# Patient Record
Sex: Male | Born: 1970 | Race: White | Hispanic: No | Marital: Married | Smoking: Never smoker
Health system: Southern US, Community
[De-identification: ages and names within clinical notes are randomized; demographics above are authoritative.]

## PROBLEM LIST (undated history)

## (undated) DIAGNOSIS — K219 Gastro-esophageal reflux disease without esophagitis: Secondary | ICD-10-CM

## (undated) DIAGNOSIS — I10 Essential (primary) hypertension: Secondary | ICD-10-CM

## (undated) DIAGNOSIS — N189 Chronic kidney disease, unspecified: Secondary | ICD-10-CM

## (undated) HISTORY — PX: KNEE SURGERY: SHX244

---

## 2004-01-29 ENCOUNTER — Encounter: Admission: RE | Admit: 2004-01-29 | Discharge: 2004-04-28 | Payer: Self-pay | Admitting: Family Medicine

## 2012-08-26 ENCOUNTER — Emergency Department (HOSPITAL_BASED_OUTPATIENT_CLINIC_OR_DEPARTMENT_OTHER): Payer: No Typology Code available for payment source

## 2012-08-26 ENCOUNTER — Emergency Department (HOSPITAL_BASED_OUTPATIENT_CLINIC_OR_DEPARTMENT_OTHER)
Admission: EM | Admit: 2012-08-26 | Discharge: 2012-08-26 | Disposition: A | Discharge status: Home / Self Care | Payer: No Typology Code available for payment source | Attending: Emergency Medicine | Admitting: Emergency Medicine

## 2012-08-26 ENCOUNTER — Encounter (HOSPITAL_BASED_OUTPATIENT_CLINIC_OR_DEPARTMENT_OTHER): Payer: Self-pay | Admitting: *Deleted

## 2012-08-26 DIAGNOSIS — S8000XA Contusion of unspecified knee, initial encounter: Secondary | ICD-10-CM | POA: Insufficient documentation

## 2012-08-26 DIAGNOSIS — I10 Essential (primary) hypertension: Secondary | ICD-10-CM | POA: Insufficient documentation

## 2012-08-26 DIAGNOSIS — Y92410 Unspecified street and highway as the place of occurrence of the external cause: Secondary | ICD-10-CM | POA: Insufficient documentation

## 2012-08-26 DIAGNOSIS — Y93I9 Activity, other involving external motion: Secondary | ICD-10-CM | POA: Insufficient documentation

## 2012-08-26 DIAGNOSIS — S8001XA Contusion of right knee, initial encounter: Secondary | ICD-10-CM

## 2012-08-26 HISTORY — DX: Essential (primary) hypertension: I10

## 2012-08-26 MED ORDER — HYDROCODONE-ACETAMINOPHEN 5-325 MG PO TABS: 2.0000 | ORAL_TABLET | ORAL | Status: DC | PRN
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
## 2012-08-27 NOTE — ED Provider Notes (Signed)
Medical screening examination/treatment/procedure(s) were performed by non-physician practitioner and as supervising physician I was immediately available for consultation/collaboration.  Chasen Mendell, MD 08/27/12 0842 

## 2012-08-29 ENCOUNTER — Emergency Department (HOSPITAL_BASED_OUTPATIENT_CLINIC_OR_DEPARTMENT_OTHER)
Admission: EM | Admit: 2012-08-29 | Discharge: 2012-08-29 | Disposition: A | Discharge status: Home / Self Care | Payer: No Typology Code available for payment source | Attending: Emergency Medicine | Admitting: Emergency Medicine

## 2012-08-29 ENCOUNTER — Emergency Department (HOSPITAL_BASED_OUTPATIENT_CLINIC_OR_DEPARTMENT_OTHER): Payer: No Typology Code available for payment source

## 2012-08-29 ENCOUNTER — Encounter (HOSPITAL_BASED_OUTPATIENT_CLINIC_OR_DEPARTMENT_OTHER): Payer: Self-pay | Admitting: Family Medicine

## 2012-08-29 DIAGNOSIS — R112 Nausea with vomiting, unspecified: Secondary | ICD-10-CM | POA: Insufficient documentation

## 2012-08-29 DIAGNOSIS — R197 Diarrhea, unspecified: Secondary | ICD-10-CM | POA: Insufficient documentation

## 2012-08-29 DIAGNOSIS — K5289 Other specified noninfective gastroenteritis and colitis: Secondary | ICD-10-CM | POA: Insufficient documentation

## 2012-08-29 DIAGNOSIS — Z79899 Other long term (current) drug therapy: Secondary | ICD-10-CM | POA: Insufficient documentation

## 2012-08-29 DIAGNOSIS — K529 Noninfective gastroenteritis and colitis, unspecified: Secondary | ICD-10-CM

## 2012-08-29 DIAGNOSIS — R51 Headache: Secondary | ICD-10-CM | POA: Insufficient documentation

## 2012-08-29 DIAGNOSIS — I10 Essential (primary) hypertension: Secondary | ICD-10-CM | POA: Insufficient documentation

## 2012-08-29 LAB — URINALYSIS, ROUTINE W REFLEX MICROSCOPIC
Bilirubin Urine: NEGATIVE
Ketones, ur: NEGATIVE mg/dL
Nitrite: NEGATIVE
Protein, ur: NEGATIVE mg/dL
Specific Gravity, Urine: 1.042 — ABNORMAL HIGH (ref 1.005–1.030)
Urobilinogen, UA: 0.2 mg/dL (ref 0.0–1.0)

## 2012-08-29 LAB — COMPREHENSIVE METABOLIC PANEL
ALT: 21 U/L (ref 0–53)
AST: 22 U/L (ref 0–37)
Alkaline Phosphatase: 96 U/L (ref 39–117)
CO2: 21 mEq/L (ref 19–32)
Chloride: 102 mEq/L (ref 96–112)
GFR calc Af Amer: >90 mL/min (ref >90)
GFR calc non Af Amer: >90 mL/min (ref >90)
Glucose, Bld: 113 mg/dL — ABNORMAL HIGH (ref 70–99)
Sodium: 139 mEq/L (ref 135–145)
Total Bilirubin: 0.7 mg/dL (ref 0.3–1.2)

## 2012-08-29 LAB — CBC WITH DIFFERENTIAL/PLATELET
Basophils Absolute: 0 10*3/uL (ref 0.0–0.1)
Eosinophils Relative: 0 % (ref 0–5)
HCT: 42.2 % (ref 39.0–52.0)
Lymphocytes Relative: 2 % — ABNORMAL LOW (ref 12–46)
Lymphs Abs: 0.2 10*3/uL — ABNORMAL LOW (ref 0.7–4.0)
MCV: 80.2 fL (ref 78.0–100.0)
Neutro Abs: 12 10*3/uL — ABNORMAL HIGH (ref 1.7–7.7)
Platelets: 179 10*3/uL (ref 150–400)
RBC: 5.26 MIL/uL (ref 4.22–5.81)
RDW: 12.4 % (ref 11.5–15.5)
WBC: 12.9 10*3/uL — ABNORMAL HIGH (ref 4.0–10.5)

## 2012-08-29 LAB — URINE MICROSCOPIC-ADD ON

## 2012-08-29 MED ORDER — ONDANSETRON HCL 4 MG/2ML IJ SOLN
4.0000 mg | Freq: Once | INTRAMUSCULAR | Status: AC
  Administered 2012-08-29: 4 mg via INTRAVENOUS
  Filled 2012-08-29: qty 2

## 2012-08-29 MED ORDER — SODIUM CHLORIDE 0.9 % IV SOLN
Freq: Once | INTRAVENOUS | Status: AC
  Administered 2012-08-29: 1000 mL via INTRAVENOUS

## 2012-08-29 MED ORDER — IOHEXOL 300 MG/ML  SOLN
100.0000 mL | Freq: Once | INTRAMUSCULAR | Status: AC | PRN
  Administered 2012-08-29: 100 mL via INTRAVENOUS

## 2012-08-29 MED ORDER — SODIUM CHLORIDE 0.9 % IV BOLUS (SEPSIS)
1000.0000 mL | Freq: Once | INTRAVENOUS | Status: AC
  Administered 2012-08-29: 1000 mL via INTRAVENOUS

## 2012-08-29 MED ORDER — SODIUM CHLORIDE 0.9 % IV SOLN: Freq: Once | INTRAVENOUS | Status: DC

## 2012-08-29 MED ORDER — ACETAMINOPHEN 325 MG PO TABS
650.0000 mg | ORAL_TABLET | Freq: Once | ORAL | Status: AC
  Administered 2012-08-29: 650 mg via ORAL
  Filled 2012-08-29: qty 2

## 2012-08-29 MED ORDER — ONDANSETRON 8 MG PO TBDP: 8.0000 mg | ORAL_TABLET | Freq: Three times a day (TID) | ORAL | Status: DC | PRN
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
## 2012-08-29 NOTE — ED Notes (Signed)
Patient transported to CT 

## 2015-08-31 ENCOUNTER — Emergency Department (HOSPITAL_BASED_OUTPATIENT_CLINIC_OR_DEPARTMENT_OTHER): Payer: 59

## 2015-08-31 ENCOUNTER — Emergency Department (HOSPITAL_BASED_OUTPATIENT_CLINIC_OR_DEPARTMENT_OTHER)
Admission: EM | Admit: 2015-08-31 | Discharge: 2015-08-31 | Disposition: A | Discharge status: Home / Self Care | Payer: 59 | Attending: Emergency Medicine | Admitting: Emergency Medicine

## 2015-08-31 ENCOUNTER — Encounter (HOSPITAL_BASED_OUTPATIENT_CLINIC_OR_DEPARTMENT_OTHER): Payer: Self-pay

## 2015-08-31 DIAGNOSIS — R1031 Right lower quadrant pain: Secondary | ICD-10-CM | POA: Diagnosis present

## 2015-08-31 DIAGNOSIS — R112 Nausea with vomiting, unspecified: Secondary | ICD-10-CM | POA: Insufficient documentation

## 2015-08-31 DIAGNOSIS — R109 Unspecified abdominal pain: Secondary | ICD-10-CM

## 2015-08-31 DIAGNOSIS — Z8719 Personal history of other diseases of the digestive system: Secondary | ICD-10-CM | POA: Diagnosis not present

## 2015-08-31 DIAGNOSIS — K219 Gastro-esophageal reflux disease without esophagitis: Secondary | ICD-10-CM | POA: Insufficient documentation

## 2015-08-31 DIAGNOSIS — N201 Calculus of ureter: Secondary | ICD-10-CM | POA: Diagnosis not present

## 2015-08-31 DIAGNOSIS — I10 Essential (primary) hypertension: Secondary | ICD-10-CM | POA: Diagnosis not present

## 2015-08-31 HISTORY — DX: Gastro-esophageal reflux disease without esophagitis: K21.9

## 2015-08-31 LAB — URINALYSIS, ROUTINE W REFLEX MICROSCOPIC
Bilirubin Urine: NEGATIVE
GLUCOSE, UA: NEGATIVE mg/dL
Ketones, ur: NEGATIVE mg/dL
Leukocytes, UA: NEGATIVE
Nitrite: NEGATIVE
PROTEIN: NEGATIVE mg/dL
SPECIFIC GRAVITY, URINE: 1.029 (ref 1.005–1.030)
Urobilinogen, UA: 0.2 mg/dL (ref 0.0–1.0)
pH: 5 (ref 5.0–8.0)

## 2015-08-31 LAB — URINE MICROSCOPIC-ADD ON

## 2015-08-31 MED ORDER — TAMSULOSIN HCL 0.4 MG PO CAPS: ORAL_CAPSULE | ORAL | Status: DC

## 2015-08-31 MED ORDER — TAMSULOSIN HCL 0.4 MG PO CAPS
0.4000 mg | ORAL_CAPSULE | Freq: Once | ORAL | Status: AC
  Administered 2015-08-31: 0.4 mg via ORAL
  Filled 2015-08-31: qty 1

## 2015-08-31 MED ORDER — ONDANSETRON HCL 4 MG/2ML IJ SOLN
4.0000 mg | Freq: Once | INTRAMUSCULAR | Status: AC
  Administered 2015-08-31: 4 mg via INTRAVENOUS
  Filled 2015-08-31: qty 2

## 2015-08-31 MED ORDER — KETOROLAC TROMETHAMINE 15 MG/ML IJ SOLN
15.0000 mg | Freq: Once | INTRAMUSCULAR | Status: AC
  Administered 2015-08-31: 15 mg via INTRAVENOUS
  Filled 2015-08-31: qty 1

## 2015-08-31 MED ORDER — SODIUM CHLORIDE 0.9 % IV SOLN
INTRAVENOUS | Status: DC
  Administered 2015-08-31: 1000 mL via INTRAVENOUS

## 2015-08-31 MED ORDER — ONDANSETRON 8 MG PO TBDP: 8.0000 mg | ORAL_TABLET | Freq: Three times a day (TID) | ORAL | Status: AC | PRN

## 2015-08-31 MED ORDER — HYDROMORPHONE HCL 1 MG/ML IJ SOLN
1.0000 mg | Freq: Once | INTRAMUSCULAR | Status: AC
  Administered 2015-08-31: 1 mg via INTRAVENOUS
  Filled 2015-08-31: qty 1

## 2015-08-31 MED ORDER — HYDROMORPHONE HCL 4 MG PO TABS: 2.0000 mg | ORAL_TABLET | ORAL | Status: DC | PRN

## 2015-08-31 NOTE — ED Notes (Signed)
Reports right flank pain that started around 1am with nausea.  Reports started in stomach but not in right flank.

## 2015-08-31 NOTE — ED Provider Notes (Signed)
CSN: [id number]     Arrival date & time 08/31/15  0246 History   First MD Initiated Contact with Patient 08/31/15 0254     Chief Complaint  Patient presents with  . Flank Pain     (Consider location/radiation/quality/duration/timing/severity/associated sxs/prior Treatment) HPI  This is a 44 year old male who had the sudden onset of right lower quadrant pain radiating to his right flank that awakened him from sleep about 2 hours ago. He rates the pain a 7 out of 10. The pain is not changed with movement or palpation. There is been associated nausea and vomiting. He has not noticed any blood in his urine. He has no history of kidney stones. He characterizes the pain as not like anything he is experienced in the past.  Past Medical History  Diagnosis Date  . Hypertension   . GERD (gastroesophageal reflux disease)    Past Surgical History  Procedure Laterality Date  . Knee surgery     No family history on file. Social History  Substance Use Topics  . Smoking status: Never Smoker   . Smokeless tobacco: None  . Alcohol Use: No    Review of Systems  All other systems reviewed and are negative.   Allergies  Ampicillin  Home Medications   Prior to Admission medications   Medication Sig Start Date End Date Taking? Authorizing Provider  omeprazole (PRILOSEC) 40 MG capsule Take 40 mg by mouth daily.   Yes Historical Provider, MD  BENAZEPRIL HCL PO Take by mouth.    Historical Provider, MD  HYDROcodone-acetaminophen (NORCO/VICODIN) 5-325 MG per tablet Take 2 tablets by mouth every 4 (four) hours as needed for pain. 08/26/12   Elson Areas, PA-C  ondansetron (ZOFRAN ODT) 8 MG disintegrating tablet Take 1 tablet (8 mg total) by mouth every 8 (eight) hours as needed for nausea. 08/29/12   Rolan Bucco, MD   BP 142/102 mmHg  Pulse 84  Temp(Src) 98.3 F (36.8 C) (Oral)  Resp 20  Ht 6' (1.829 m)  Wt 245 lb (111.131 kg)  BMI 33.22 kg/m2  SpO2 96%   Physical Exam  General:  Well-developed, well-nourished male in no acute distress; appearance consistent with age of record HENT: normocephalic; atraumatic Eyes: pupils equal, round and reactive to light; extraocular muscles intact Neck: supple Heart: regular rate and rhythm Lungs: clear to auscultation bilaterally Abdomen: soft; nondistended; nontender; no masses or hepatosplenomegaly; bowel sounds present GU: No CVA tenderness Extremities: No deformity; full range of motion; pulses normal Neurologic: Awake, alert and oriented; motor function intact in all extremities and symmetric; no facial droop Skin: Warm and dry Psychiatric: Normal mood and affect    ED Course  Procedures (including critical care time)   MDM  Nursing notes and vitals signs, including pulse oximetry, reviewed.  Summary of this visit's results, reviewed by myself:  Labs:  Results for orders placed or performed during the hospital encounter of 08/31/15 (from the past 24 hour(s))  Urinalysis, Routine w reflex microscopic (not at [facility])     Status: Abnormal   Collection Time: 08/31/15  3:02 AM  Result Value Ref Range   Color, Urine YELLOW YELLOW   APPearance CLOUDY (A) CLEAR   Specific Gravity, Urine 1.029 1.005 - 1.030   pH 5.0 5.0 - 8.0   Glucose, UA NEGATIVE NEGATIVE mg/dL   Hgb urine dipstick SMALL (A) NEGATIVE   Bilirubin Urine NEGATIVE NEGATIVE   Ketones, ur NEGATIVE NEGATIVE mg/dL   Protein, ur NEGATIVE NEGATIVE mg/dL   Urobilinogen,  UA 0.2 0.0 - 1.0 mg/dL   Nitrite NEGATIVE NEGATIVE   Leukocytes, UA NEGATIVE NEGATIVE  Urine microscopic-add on     Status: None   Collection Time: 08/31/15  3:02 AM  Result Value Ref Range   Squamous Epithelial / LPF RARE RARE   WBC, UA 0-2 <3 WBC/hpf   RBC / HPF 3-6 <3 RBC/hpf   Urine-Other MUCOUS PRESENT     Imaging Studies: Ct Renal Stone Study  08/31/2015  CLINICAL DATA:  Acute onset of right flank pain.  Initial encounter. EXAM: CT ABDOMEN AND PELVIS WITHOUT CONTRAST TECHNIQUE:  Multidetector CT imaging of the abdomen and pelvis was performed following the standard protocol without IV contrast. COMPARISON:  CT of the abdomen and pelvis from 08/29/2012 FINDINGS: The visualized lung bases are clear. The liver and spleen are unremarkable in appearance. The gallbladder is within normal limits. The pancreas and adrenal glands are unremarkable. There is minimal right-sided hydronephrosis, with an obstructing 7 x 6 mm stone noted in the proximal right ureter, 3 cm below the right renal pelvis. Nonspecific perinephric stranding is noted bilaterally. No nonobstructing renal stones are identified. No free fluid is identified. The small bowel is unremarkable in appearance. The stomach is within normal limits. No acute vascular abnormalities are seen. The appendix is normal in caliber, without evidence of appendicitis. Minimal diverticulosis is noted at the proximal sigmoid colon. The colon is otherwise unremarkable in appearance. The bladder is decompressed and not well assessed. The prostate is normal in size. No inguinal lymphadenopathy is seen. No acute osseous abnormalities are identified. IMPRESSION: 1. Minimal right-sided hydronephrosis, with an obstructing 7 x 6 mm stone in the proximal right ureter, 3 cm below the right renal pelvis. 2. Minimal diverticulosis at the proximal sigmoid colon. Electronically Signed   By: Roanna RaiderJeffery  Chang M.D.   On: 08/31/2015 03:39   3:48 AM Pain well relieved with IV medications.     Paula LibraJohn Elyshia Kumagai, MD 08/31/15 [phone]

## 2015-09-02 ENCOUNTER — Encounter (HOSPITAL_COMMUNITY): Payer: Self-pay | Admitting: General Practice

## 2015-09-02 ENCOUNTER — Other Ambulatory Visit: Payer: Self-pay | Admitting: Urology

## 2015-09-02 ENCOUNTER — Ambulatory Visit (HOSPITAL_COMMUNITY): Payer: 59

## 2015-09-02 ENCOUNTER — Encounter (HOSPITAL_COMMUNITY)
Admission: AD | Disposition: A | Discharge status: Home / Self Care | Payer: Self-pay | Source: Ambulatory Visit | Attending: Urology

## 2015-09-02 ENCOUNTER — Ambulatory Visit (HOSPITAL_COMMUNITY)
Admission: AD | Admit: 2015-09-02 | Discharge: 2015-09-02 | Disposition: A | Discharge status: Home / Self Care | Payer: 59 | Source: Ambulatory Visit | Attending: Urology | Admitting: Urology

## 2015-09-02 DIAGNOSIS — I10 Essential (primary) hypertension: Secondary | ICD-10-CM | POA: Diagnosis not present

## 2015-09-02 DIAGNOSIS — N201 Calculus of ureter: Secondary | ICD-10-CM | POA: Insufficient documentation

## 2015-09-02 DIAGNOSIS — Z6834 Body mass index (BMI) 34.0-34.9, adult: Secondary | ICD-10-CM | POA: Diagnosis not present

## 2015-09-02 DIAGNOSIS — E669 Obesity, unspecified: Secondary | ICD-10-CM | POA: Diagnosis not present

## 2015-09-02 DIAGNOSIS — K219 Gastro-esophageal reflux disease without esophagitis: Secondary | ICD-10-CM | POA: Diagnosis not present

## 2015-09-02 HISTORY — DX: Chronic kidney disease, unspecified: N18.9

## 2015-09-02 SURGERY — LITHOTRIPSY, ESWL
Anesthesia: LOCAL | Laterality: Right

## 2015-09-02 MED ORDER — HYDROMORPHONE HCL 4 MG PO TABS: 4.0000 mg | ORAL_TABLET | ORAL | Status: AC | PRN

## 2015-09-02 MED ORDER — CEFAZOLIN SODIUM-DEXTROSE 2-3 GM-% IV SOLR
2.0000 g | INTRAVENOUS | Status: AC
Start: 2015-09-02 — End: 2015-09-02
  Administered 2015-09-02: 2 g via INTRAVENOUS
  Filled 2015-09-02: qty 50

## 2015-09-02 MED ORDER — SODIUM CHLORIDE 0.9 % IV SOLN
INTRAVENOUS | Status: DC
  Administered 2015-09-02: 15:00:00 via INTRAVENOUS

## 2015-09-02 MED ORDER — SULFAMETHOXAZOLE-TRIMETHOPRIM 800-160 MG PO TABS: 1.0000 | ORAL_TABLET | Freq: Two times a day (BID) | ORAL | Status: AC

## 2015-09-02 MED ORDER — DIPHENHYDRAMINE HCL 25 MG PO CAPS
25.0000 mg | ORAL_CAPSULE | ORAL | Status: AC
  Administered 2015-09-02: 25 mg via ORAL
  Filled 2015-09-02: qty 1

## 2015-09-02 MED ORDER — DIAZEPAM 5 MG PO TABS
10.0000 mg | ORAL_TABLET | ORAL | Status: AC
  Administered 2015-09-02: 10 mg via ORAL
  Filled 2015-09-02: qty 2

## 2015-09-02 MED ORDER — SENNOSIDES-DOCUSATE SODIUM 8.6-50 MG PO TABS: 1.0000 | ORAL_TABLET | Freq: Two times a day (BID) | ORAL | Status: AC

## 2015-09-02 MED ORDER — TAMSULOSIN HCL 0.4 MG PO CAPS: ORAL_CAPSULE | ORAL | Status: AC

## 2015-09-02 NOTE — Discharge Instructions (Signed)
1 - You may have urinary urgency (bladder spasms), bloody urine on / off, and pass small stone fragments for up to 2 weeks. This is normal. ° °2 - Call MD or go to ER for fever >102, severe pain / nausea / vomiting not relieved by medications, or acute change in medical status ° °

## 2015-09-02 NOTE — Brief Op Note (Signed)
09/02/2015  6:13 PM  PATIENT:  Steven Preston  44 y.o. male  PRE-OPERATIVE DIAGNOSIS:  RIGHT URETERAL STONE  POST-OPERATIVE DIAGNOSIS:  * No post-op diagnosis entered *  PROCEDURE:  Procedure(s): RIGHT EXTRACORPOREAL SHOCK WAVE LITHOTRIPSY (ESWL) (Right)  SURGEON:  Surgeon(s) and Role:    * Sebastian Acheheodore Izzie Geers, MD - Primary  PHYSICIAN ASSISTANT:   ASSISTANTS: none   ANESTHESIA:   MAC  EBL:     BLOOD ADMINISTERED:none  DRAINS: none   LOCAL MEDICATIONS USED:  NONE  SPECIMEN:  No Specimen  DISPOSITION OF SPECIMEN:  N/A  COUNTS:  YES  TOURNIQUET:  * No tourniquets in log *  DICTATION: .Note written in paper chart  PLAN OF CARE: Discharge to home after PACU  PATIENT DISPOSITION:  PACU - hemodynamically stable.   Delay start of Pharmacological VTE agent (>24hrs) due to surgical blood loss or risk of bleeding: yes

## 2015-09-02 NOTE — H&P (Signed)
Steven Preston is an 44 y.o. male.    Chief Complaint: Pre-op Right Shockwave Lithotripsy  HPI:   1 - Nephrolithiasis - 7mm Rt prox ureteral stone (700 HU, SSD 15cm) visible just lateral to upper border of L4 on CT scout image by ER CT 08/2015. No additional stones, mild hydro. UA without in fectious parameters. NO fevers. NO prior episodes. KUB today shows some antegrade progression to lower 1/3 or ueter.   PMH sig for ortho survery, GERD, HTN (ACEI). 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 His PCP is Steven HazelLisa Miller MD with Steven Preston.  Today "Steven Preston" is seen to proceed with shockwave lithotripsy for his bothersome right ureteral stone. No interval fevers.  Past Medical History  Diagnosis Date  . Hypertension   . GERD (gastroesophageal reflux disease)     Past Surgical History  Procedure Laterality Date  . Knee surgery      No family history on file. Social History:  reports that he has never smoked. He does not have any smokeless tobacco history on file. He reports that he does not drink alcohol or use illicit drugs.  Allergies:  Allergies  Allergen Reactions  . Ampicillin     No prescriptions prior to admission    No results found for this or any previous visit (from the past 48 hour(s)). No results found.  Review of Systems  Constitutional: Negative.  Negative for fever and chills.  HENT: Negative.   Eyes: Negative.   Respiratory: Negative.   Cardiovascular: Negative.   Gastrointestinal: Positive for nausea. Negative for vomiting.  Genitourinary: Positive for flank pain.  Musculoskeletal: Negative.   Skin: Negative.   Neurological: Negative.   Endo/Heme/Allergies: Negative.   Psychiatric/Behavioral: Negative.     There were no vitals taken for this visit. Physical Exam  Constitutional: He appears well-developed.  HENT:  Head: Normocephalic.  Eyes: Pupils are equal, round, and reactive to light.  Neck: Normal range of motion.  Cardiovascular: Normal rate.    Respiratory: Effort normal.  GI: Soft.  Genitourinary:  Moderate Rt CVAT with radiation to Rt groin.   Musculoskeletal: Normal range of motion.  Neurological: He is alert.  Skin: Skin is warm.  Psychiatric: He has a normal mood and affect. His behavior is normal. Judgment and thought content normal.     Assessment/Plan  1 - Nephrolithiasis - Pt with some antegrade progression by KUB today. He is quite miserable from colic symptoms and wants to proceed with definitive therapy next available.  We rediscussed shockwave lithotripsy in detail as well as my "rule of 9s" with stones <139mm, less than 900 HU, and skin to stone distance <9cm having approximately 90% treatment success with single session of treatment. We then readdressed how stones that are larger, more dense, and in patients with less favorable anatomy have incrementally decreased success rates. We rediscussed risks including, bleeding, infection, hematoma, loss of kidney, need for staged therapy, need for adjunctive therapy and requirement to refrain from any anticoagulants, anti-platelet or aspirin-like products peri-procedureally.   After careful consideration, the patient has chosen to proceed today as planned.   Steven Preston 09/02/2015, 12:24 PM

## 2015-09-03 ENCOUNTER — Emergency Department (HOSPITAL_BASED_OUTPATIENT_CLINIC_OR_DEPARTMENT_OTHER): Payer: 59

## 2015-09-03 ENCOUNTER — Emergency Department (HOSPITAL_COMMUNITY)
Admission: EM | Admit: 2015-09-03 | Discharge: 2015-09-04 | Disposition: A | Discharge status: Home / Self Care | Payer: 59 | Attending: Emergency Medicine | Admitting: Emergency Medicine

## 2015-09-03 ENCOUNTER — Encounter (HOSPITAL_BASED_OUTPATIENT_CLINIC_OR_DEPARTMENT_OTHER): Payer: Self-pay | Admitting: Emergency Medicine

## 2015-09-03 DIAGNOSIS — Z87442 Personal history of urinary calculi: Secondary | ICD-10-CM | POA: Insufficient documentation

## 2015-09-03 DIAGNOSIS — N132 Hydronephrosis with renal and ureteral calculous obstruction: Secondary | ICD-10-CM | POA: Insufficient documentation

## 2015-09-03 DIAGNOSIS — K219 Gastro-esophageal reflux disease without esophagitis: Secondary | ICD-10-CM | POA: Diagnosis not present

## 2015-09-03 DIAGNOSIS — Z6834 Body mass index (BMI) 34.0-34.9, adult: Secondary | ICD-10-CM | POA: Insufficient documentation

## 2015-09-03 DIAGNOSIS — N200 Calculus of kidney: Secondary | ICD-10-CM | POA: Diagnosis not present

## 2015-09-03 DIAGNOSIS — Z9889 Other specified postprocedural states: Secondary | ICD-10-CM | POA: Diagnosis not present

## 2015-09-03 DIAGNOSIS — Z79899 Other long term (current) drug therapy: Secondary | ICD-10-CM | POA: Diagnosis not present

## 2015-09-03 DIAGNOSIS — I10 Essential (primary) hypertension: Secondary | ICD-10-CM | POA: Insufficient documentation

## 2015-09-03 DIAGNOSIS — E669 Obesity, unspecified: Secondary | ICD-10-CM | POA: Insufficient documentation

## 2015-09-03 DIAGNOSIS — R109 Unspecified abdominal pain: Secondary | ICD-10-CM

## 2015-09-03 DIAGNOSIS — R1032 Left lower quadrant pain: Secondary | ICD-10-CM | POA: Diagnosis present

## 2015-09-03 LAB — CBC WITH DIFFERENTIAL/PLATELET
Basophils Absolute: 0.1 10*3/uL (ref 0.0–0.1)
Basophils Relative: 1 %
EOS PCT: 0 %
Eosinophils Absolute: 0.1 10*3/uL (ref 0.0–0.7)
HCT: 37.1 % — ABNORMAL LOW (ref 39.0–52.0)
Hemoglobin: 13.3 g/dL (ref 13.0–17.0)
LYMPHS ABS: 1.1 10*3/uL (ref 0.7–4.0)
LYMPHS PCT: 8 %
MCH: 29.2 pg (ref 26.0–34.0)
MCHC: 35.8 g/dL (ref 30.0–36.0)
MCV: 81.4 fL (ref 78.0–100.0)
MONO ABS: 1.4 10*3/uL — AB (ref 0.1–1.0)
MONOS PCT: 10 %
Neutro Abs: 10.8 10*3/uL — ABNORMAL HIGH (ref 1.7–7.7)
Neutrophils Relative %: 81 %
PLATELETS: 241 10*3/uL (ref 150–400)
RBC: 4.56 MIL/uL (ref 4.22–5.81)
RDW: 12.3 % (ref 11.5–15.5)
WBC: 13.4 10*3/uL — ABNORMAL HIGH (ref 4.0–10.5)

## 2015-09-03 LAB — URINE MICROSCOPIC-ADD ON

## 2015-09-03 LAB — URINALYSIS, ROUTINE W REFLEX MICROSCOPIC
Bilirubin Urine: NEGATIVE
GLUCOSE, UA: NEGATIVE mg/dL
Ketones, ur: 15 mg/dL — AB
Nitrite: NEGATIVE
Protein, ur: 30 mg/dL — AB
SPECIFIC GRAVITY, URINE: 1.021 (ref 1.005–1.030)
pH: 6 (ref 5.0–8.0)

## 2015-09-03 LAB — COMPREHENSIVE METABOLIC PANEL
ALT: 50 U/L (ref 17–63)
AST: 43 U/L — ABNORMAL HIGH (ref 15–41)
Albumin: 4 g/dL (ref 3.5–5.0)
Alkaline Phosphatase: 90 U/L (ref 38–126)
Anion gap: 7 (ref 5–15)
BUN: 14 mg/dL (ref 6–20)
CHLORIDE: 103 mmol/L (ref 101–111)
CO2: 25 mmol/L (ref 22–32)
CREATININE: 1.86 mg/dL — AB (ref 0.61–1.24)
Calcium: 8.6 mg/dL — ABNORMAL LOW (ref 8.9–10.3)
GFR, EST AFRICAN AMERICAN: 48 mL/min — AB (ref >60)
GFR, EST NON AFRICAN AMERICAN: 42 mL/min — AB (ref >60)
Glucose, Bld: 126 mg/dL — ABNORMAL HIGH (ref 65–99)
POTASSIUM: 3.6 mmol/L (ref 3.5–5.1)
Sodium: 135 mmol/L (ref 135–145)
Total Bilirubin: 1.1 mg/dL (ref 0.3–1.2)
Total Protein: 7.4 g/dL (ref 6.5–8.1)

## 2015-09-03 MED ORDER — HYDROMORPHONE HCL 1 MG/ML IJ SOLN
1.0000 mg | Freq: Once | INTRAMUSCULAR | Status: AC
  Administered 2015-09-03: 1 mg via INTRAVENOUS
  Filled 2015-09-03: qty 1

## 2015-09-03 MED ORDER — ONDANSETRON HCL 4 MG/2ML IJ SOLN
4.0000 mg | Freq: Once | INTRAMUSCULAR | Status: AC
  Administered 2015-09-03: 4 mg via INTRAVENOUS
  Filled 2015-09-03: qty 2

## 2015-09-03 MED ORDER — SODIUM CHLORIDE 0.9 % IV BOLUS (SEPSIS)
1000.0000 mL | Freq: Once | INTRAVENOUS | Status: AC
Start: 2015-09-03 — End: 2015-09-03
  Administered 2015-09-03: 1000 mL via INTRAVENOUS

## 2015-09-03 NOTE — ED Notes (Signed)
Pt c/o flank pain and abd pain,  Had lithotripsy yesterday for 6 X 7 mm kidney pain  Bruising to rt groin are  Cont to have pain and constipation

## 2015-09-03 NOTE — Discharge Instructions (Signed)
Flank Pain °Flank pain refers to pain that is located on the side of the body between the upper abdomen and the back. The pain may occur over a short period of time (acute) or may be long-term or reoccurring (chronic). It may be mild or severe. Flank pain can be caused by many things. °CAUSES  °Some of the more common causes of flank pain include: °· Muscle strains.   °· Muscle spasms.   °· A disease of your spine (vertebral disk disease).   °· A lung infection (pneumonia).   °· Fluid around your lungs (pulmonary edema).   °· A kidney infection.   °· Kidney stones.   °· A very painful skin rash caused by the chickenpox virus (shingles).   °· Gallbladder disease.   °HOME CARE INSTRUCTIONS  °Home care will depend on the cause of your pain. In general, °· Rest as directed by your caregiver. °· Drink enough fluids to keep your urine clear or pale yellow. °· Only take over-the-counter or prescription medicines as directed by your caregiver. Some medicines may help relieve the pain. °· Tell your caregiver about any changes in your pain. °· Follow up with your caregiver as directed. °SEEK IMMEDIATE MEDICAL CARE IF:  °· Your pain is not controlled with medicine.   °· You have new or worsening symptoms. °· Your pain increases.   °· You have abdominal pain.   °· You have shortness of breath.   °· You have persistent nausea or vomiting.   °· You have swelling in your abdomen.   °· You feel faint or pass out.   °· You have blood in your urine. °· You have a fever or persistent symptoms for more than 2-3 days. °· You have a fever and your symptoms suddenly get worse. °MAKE SURE YOU:  °· Understand these instructions. °· Will watch your condition. °· Will get help right away if you are not doing well or get worse. °  °This information is not intended to replace advice given to you by your health care provider. Make sure you discuss any questions you have with your health care provider. °  °Document Released: 11/26/2005 Document  Revised: 06/29/2012 Document Reviewed: 05/19/2012 °Elsevier Interactive Patient Education ©2016 [employer] ° °

## 2015-09-03 NOTE — ED Notes (Signed)
Patient reports since lunch time he has been unable to pass "gas" since lunch time. He had lithotripsy yesterday and was "successful" Patient reports he lower pelvic pain and bloating.

## 2015-09-03 NOTE — ED Provider Notes (Signed)
CSN: [id number]     Arrival date & time 09/03/15  1918 History  By signing my name below, I, Gwenyth Oberatherine Macek, attest that this documentation has been prepared under the direction and in the presence of Alvira MondayErin Rembert Browe, MD.  Electronically Signed: Gwenyth Oberatherine Macek, ED Scribe. 09/03/2015. 8:01 PM.   Chief Complaint  Patient presents with  . Flank Pain   Patient is a 44 y.o. male presenting with abdominal pain. The history is provided by the patient. No language interpreter was used.  Abdominal Pain Pain location:  LLQ and RLQ Pain quality: sharp   Pain severity:  Moderate Onset quality:  Gradual Duration:  1 day Progression:  Waxing and waning Chronicity:  New Context: previous surgery   Ineffective treatments:  Acetaminophen Associated symptoms: constipation, nausea and vomiting (one episode this AM)   Associated symptoms: no chest pain, no diarrhea, no fever, no shortness of breath and no sore throat     HPI Comments: Steven DaubDon W Preston is a 44 y.o. male with a history of HTN and kidney stones who presents to the Emergency Department complaining of waxing and waning, sharp, bilateral lower abdominal pain, worse on the right, that started this morning. Pt states 1 episode of vomiting, inability to pass gas, lower back pain and decreased appetite as associated symptoms. He was able to tolerate PO intake PTA. Pt has taken Tylenol, Flomax, a laxative and Gas-X with no relief. Pt was seen in the ED on 11/12 and was diagnosed with a 7 mm uretal stone on the right, which required lithotripsy yesterday. He reports back pain is similar to pain experienced with kidney stones. Pt had a small BM following lithotripsy, but has not been able to pass gas since this morning.  Pt denies a history of other abdominal surgeries. He also denies difficulty urinating.  Urologist Berneice HeinrichManny  Past Medical History  Diagnosis Date  . Hypertension   . GERD (gastroesophageal reflux disease)   . kidney stones    Past  Surgical History  Procedure Laterality Date  . Knee surgery Bilateral     meniscal tears repair   History reviewed. No pertinent family history. Social History  Substance Use Topics  . Smoking status: Never Smoker   . Smokeless tobacco: None  . Alcohol Use: No    Review of Systems  Constitutional: Negative for fever.  HENT: Negative for sore throat.   Eyes: Negative for visual disturbance.  Respiratory: Negative for shortness of breath.   Cardiovascular: Negative for chest pain.  Gastrointestinal: Positive for nausea, vomiting (one episode this AM), abdominal pain and constipation. Negative for diarrhea.  Genitourinary: Positive for flank pain. Negative for difficulty urinating.  Musculoskeletal: Positive for back pain. Negative for neck stiffness.  Skin: Negative for rash.  Neurological: Negative for syncope and headaches.   Allergies  Ampicillin  Home Medications   Prior to Admission medications   Medication Sig Start Date End Date Taking? Authorizing Provider  BENAZEPRIL HCL PO Take by mouth.    Historical Provider, MD  HYDROmorphone (DILAUDID) 4 MG tablet Take 1 tablet (4 mg total) by mouth every 4 (four) hours as needed for moderate pain or severe pain (for pain). From kidney stone 09/02/15   Sebastian Acheheodore Manny, MD  omeprazole (PRILOSEC) 40 MG capsule Take 40 mg by mouth daily.    Historical Provider, MD  ondansetron (ZOFRAN ODT) 8 MG disintegrating tablet Take 1 tablet (8 mg total) by mouth every 8 (eight) hours as needed for nausea or vomiting. 08/31/15  John Molpus, MD  senna-docusate (SENOKOT-S) 8.6-50 MG tablet Take 1 tablet by mouth 2 (two) times daily. While taking pain meds to prevent constipation 09/02/15   Sebastian Ache, MD  sulfamethoxazole-trimethoprim (BACTRIM DS,SEPTRA DS) 800-160 MG tablet Take 1 tablet by mouth 2 (two) times daily. X 5 days to prevent infection 09/02/15   Sebastian Ache, MD  tamsulosin [facility]) 0.4 MG CAPS capsule To help pass ureteral stone  fragments 09/02/15   Sebastian Ache, MD   BP 129/80 mmHg  Pulse 110  Temp(Src) 98.6 F (37 C) (Oral)  Resp 18  Ht 6' (1.829 m)  Wt 252 lb (114.306 kg)  BMI 34.17 kg/m2  SpO2 97% Physical Exam  Constitutional: He appears well-developed and well-nourished. No distress.  HENT:  Head: Normocephalic and atraumatic.  Eyes: Conjunctivae and EOM are normal.  Neck: Neck supple. No tracheal deviation present.  Cardiovascular: Normal rate.   Pulmonary/Chest: Effort normal. No respiratory distress.  Skin: Skin is warm and dry.  Psychiatric: He has a normal mood and affect. His behavior is normal.  Nursing note and vitals reviewed.   ED Course  Procedures  DIAGNOSTIC STUDIES: Oxygen Saturation is 100% on RA, normal by my interpretation.    COORDINATION OF CARE: 7:54 PM Discussed treatment plan with pt which includes IV Dilaudid, lab work and consultation with urology. Pt agreed to plan.   Labs Review Labs Reviewed  URINALYSIS, ROUTINE W REFLEX MICROSCOPIC (NOT AT [facility]) - Abnormal; Notable for the following:    APPearance CLOUDY (*)    Hgb urine dipstick LARGE (*)    Ketones, ur 15 (*)    Protein, ur 30 (*)    Leukocytes, UA MODERATE (*)    All other components within normal limits  CBC WITH DIFFERENTIAL/PLATELET - Abnormal; Notable for the following:    WBC 13.4 (*)    HCT 37.1 (*)    Neutro Abs 10.8 (*)    Monocytes Absolute 1.4 (*)    All other components within normal limits  COMPREHENSIVE METABOLIC PANEL - Abnormal; Notable for the following:    Glucose, Bld 126 (*)    Creatinine, Ser 1.86 (*)    Calcium 8.6 (*)    AST 43 (*)    GFR calc non Af Amer 42 (*)    GFR calc Af Amer 48 (*)    All other components within normal limits  URINE MICROSCOPIC-ADD ON - Abnormal; Notable for the following:    Squamous Epithelial / LPF 0-5 (*)    Bacteria, UA FEW (*)    All other components within normal limits   Imaging Review Dg Abd 1 View  09/02/2015  CLINICAL DATA:  Pre op  lithotripsy. Right ureteral stone EXAM: ABDOMEN - 1 VIEW COMPARISON:  CT, 08/31/2015 FINDINGS: There is an oval calculus in the right pelvis above a larger phlebolith, measuring 6 mm in size, consistent with the right ureteral stone seen on CT, now lying in the distal ureter. There is no evidence of another ureteral stone or an intrarenal stone. Several stable phleboliths are noted in the pelvis. Normal bowel gas pattern. Soft tissues otherwise unremarkable. Bony structures are unremarkable. IMPRESSION: 1. 6 mm stone now lies in the distal right ureter. Electronically Signed   By: Amie Portland M.D.   On: 09/02/2015 14:38   Ct Renal Stone Study  09/03/2015  CLINICAL DATA:  Pt states he has had bi-lateral flank pain since this afternoon. More pain on the right side. States he can't "pass gas". Pt seen  here on Friday for same and had lithotripsy yesterday. Hx of htn, gerd and kidney stones. EXAM: CT ABDOMEN AND PELVIS WITHOUT CONTRAST TECHNIQUE: Multidetector CT imaging of the abdomen and pelvis was performed following the standard protocol without IV contrast. COMPARISON:  08/31/2015 FINDINGS: Visualized lung bases clear. Unremarkable liver, nondilated gallbladder, spleen, adrenal glands, pancreas, aorta, left kidney. Unenhanced CT was performed per clinician order. Lack of IV contrast limits sensitivity and specificity, especially for evaluation of abdominal/pelvic solid viscera. There is mild right hydronephrosis and ureterectasis down to the level of a 1cm irregular stone cluster, in the mid ureter just below the level of the SI joint. Slightly more distally is a 2 mm calculus, just proximal to the ureteral orifice by 1-2 cm. There are mild inflammatory/edematous changes around the right renal collecting system and proximal ureter. The urinary bladder is incompletely distended. There are bilateral pelvic phleboliths. No ascites.  No free air.  No adenopathy. Stomach, small bowel, and colon are nondilated.   Normal appendix. IMPRESSION: 1. Obstructing distal right ureteral calculi with hydronephrosis (Steinstrasse). Electronically Signed   By: Corlis Leak M.D.   On: 09/03/2015 21:12   I have personally reviewed and evaluated these images and lab results as part of my medical decision-making.   EKG Interpretation None      MDM   Final diagnoses:  Abdominal pain  Nephrolithiasis   44yo male with history of hypertension, GERD, nephrolithiasis s/p lithotripsy to right sided stone yesterday, presents with concern for abdominal pain. DDx includes continuing renal colic, sbo, constipation, retroperitoneal hematoma.  Pt without risk factors for bowel obstruction, does not appear distended on exam, has tolerated po, and CT shows nondistended small bowel and colon and doubt sbo.  Given level of pain, CT was done which did not show any sign of retroperitoneal hematoma or other complications, however did show right ureteral calculi with hydronephrosis. Pain is likely secondary to nephrolithiasis and constipation. Discussed constipation care and pain control. Patient discharged in stable condition with understanding of reasons to return.   I personally performed the services described in this documentation, which was scribed in my presence. The recorded information has been reviewed and is accurate.   Alvira Monday, MD 09/04/15 1210

## 2015-09-03 NOTE — ED Notes (Signed)
MD at bedside. 

## 2015-09-04 ENCOUNTER — Ambulatory Visit (HOSPITAL_COMMUNITY): Payer: 59 | Admitting: Anesthesiology

## 2015-09-04 ENCOUNTER — Other Ambulatory Visit: Payer: Self-pay | Admitting: Urology

## 2015-09-04 ENCOUNTER — Ambulatory Visit (HOSPITAL_COMMUNITY)
Admission: AD | Admit: 2015-09-04 | Discharge: 2015-09-04 | Disposition: A | Discharge status: Home / Self Care | Payer: 59 | Source: Ambulatory Visit | Attending: Urology | Admitting: Urology

## 2015-09-04 ENCOUNTER — Encounter (HOSPITAL_COMMUNITY): Payer: Self-pay | Admitting: *Deleted

## 2015-09-04 ENCOUNTER — Encounter (HOSPITAL_COMMUNITY)
Admission: AD | Disposition: A | Discharge status: Home / Self Care | Payer: Self-pay | Source: Ambulatory Visit | Attending: Urology

## 2015-09-04 DIAGNOSIS — N200 Calculus of kidney: Secondary | ICD-10-CM | POA: Diagnosis not present

## 2015-09-04 DIAGNOSIS — N201 Calculus of ureter: Secondary | ICD-10-CM

## 2015-09-04 DIAGNOSIS — I10 Essential (primary) hypertension: Secondary | ICD-10-CM | POA: Diagnosis not present

## 2015-09-04 DIAGNOSIS — Z79899 Other long term (current) drug therapy: Secondary | ICD-10-CM | POA: Diagnosis not present

## 2015-09-04 DIAGNOSIS — K219 Gastro-esophageal reflux disease without esophagitis: Secondary | ICD-10-CM | POA: Diagnosis not present

## 2015-09-04 HISTORY — PX: CYSTOSCOPY WITH RETROGRADE PYELOGRAM, URETEROSCOPY AND STENT PLACEMENT: SHX5789

## 2015-09-04 SURGERY — CYSTOURETEROSCOPY, WITH RETROGRADE PYELOGRAM AND STENT INSERTION
Anesthesia: General | Site: Ureter | Laterality: Right

## 2015-09-04 MED ORDER — ONDANSETRON HCL 4 MG/2ML IJ SOLN
INTRAMUSCULAR | Status: DC | PRN
  Administered 2015-09-04: 4 mg via INTRAVENOUS

## 2015-09-04 MED ORDER — FENTANYL CITRATE (PF) 100 MCG/2ML IJ SOLN
INTRAMUSCULAR | Status: DC | PRN
  Administered 2015-09-04: 50 ug via INTRAVENOUS

## 2015-09-04 MED ORDER — SODIUM CHLORIDE 0.9 % IR SOLN
Status: DC | PRN
  Administered 2015-09-04: 1000 mL
  Administered 2015-09-04: 3000 mL
  Administered 2015-09-04: 1000 mL

## 2015-09-04 MED ORDER — ONDANSETRON HCL 4 MG/2ML IJ SOLN
INTRAMUSCULAR | Status: AC
  Filled 2015-09-04: qty 2

## 2015-09-04 MED ORDER — SODIUM CHLORIDE 0.9 % IJ SOLN: 3.0000 mL | Freq: Two times a day (BID) | INTRAMUSCULAR | Status: DC

## 2015-09-04 MED ORDER — LIDOCAINE HCL 2 % EX GEL
CUTANEOUS | Status: DC | PRN
  Administered 2015-09-04: 1 via URETHRAL

## 2015-09-04 MED ORDER — ACETAMINOPHEN 325 MG PO TABS: 650.0000 mg | ORAL_TABLET | ORAL | Status: DC | PRN

## 2015-09-04 MED ORDER — FENTANYL CITRATE (PF) 100 MCG/2ML IJ SOLN: 25.0000 ug | INTRAMUSCULAR | Status: DC | PRN

## 2015-09-04 MED ORDER — PROMETHAZINE HCL 25 MG/ML IJ SOLN: 6.2500 mg | INTRAMUSCULAR | Status: DC | PRN

## 2015-09-04 MED ORDER — EPHEDRINE SULFATE 50 MG/ML IJ SOLN
INTRAMUSCULAR | Status: DC | PRN
  Administered 2015-09-04: 5 mg via INTRAVENOUS

## 2015-09-04 MED ORDER — GLYCOPYRROLATE 0.2 MG/ML IJ SOLN
INTRAMUSCULAR | Status: DC | PRN
  Administered 2015-09-04: .4 mg via INTRAVENOUS

## 2015-09-04 MED ORDER — LIDOCAINE HCL (CARDIAC) 20 MG/ML IV SOLN
INTRAVENOUS | Status: DC | PRN
  Administered 2015-09-04: 100 mg via INTRAVENOUS

## 2015-09-04 MED ORDER — CEFAZOLIN SODIUM-DEXTROSE 2-3 GM-% IV SOLR
INTRAVENOUS | Status: AC
  Filled 2015-09-04: qty 50

## 2015-09-04 MED ORDER — BELLADONNA ALKALOIDS-OPIUM 16.2-60 MG RE SUPP
RECTAL | Status: AC
  Filled 2015-09-04: qty 1

## 2015-09-04 MED ORDER — ACETAMINOPHEN 650 MG RE SUPP
650.0000 mg | RECTAL | Status: DC | PRN
  Filled 2015-09-04: qty 1

## 2015-09-04 MED ORDER — ROCURONIUM BROMIDE 100 MG/10ML IV SOLN
INTRAVENOUS | Status: DC | PRN
  Administered 2015-09-04: 10 mg via INTRAVENOUS

## 2015-09-04 MED ORDER — LIDOCAINE HCL (CARDIAC) 20 MG/ML IV SOLN
INTRAVENOUS | Status: AC
  Filled 2015-09-04: qty 5

## 2015-09-04 MED ORDER — SODIUM CHLORIDE 0.9 % IJ SOLN: 3.0000 mL | INTRAMUSCULAR | Status: DC | PRN

## 2015-09-04 MED ORDER — PROPOFOL 10 MG/ML IV BOLUS
INTRAVENOUS | Status: AC
  Filled 2015-09-04: qty 20

## 2015-09-04 MED ORDER — PROPOFOL 10 MG/ML IV BOLUS
INTRAVENOUS | Status: DC | PRN
  Administered 2015-09-04: 250 mg via INTRAVENOUS

## 2015-09-04 MED ORDER — LIDOCAINE HCL 2 % EX GEL
CUTANEOUS | Status: AC
  Filled 2015-09-04: qty 5

## 2015-09-04 MED ORDER — PHENYLEPHRINE HCL 10 MG/ML IJ SOLN
INTRAMUSCULAR | Status: DC | PRN
  Administered 2015-09-04 (×3): 80 ug via INTRAVENOUS

## 2015-09-04 MED ORDER — KETOROLAC TROMETHAMINE 30 MG/ML IJ SOLN
30.0000 mg | Freq: Once | INTRAMUSCULAR | Status: AC
  Administered 2015-09-04: 30 mg via INTRAVENOUS

## 2015-09-04 MED ORDER — NEOSTIGMINE METHYLSULFATE 10 MG/10ML IV SOLN
INTRAVENOUS | Status: DC | PRN
  Administered 2015-09-04: 3 mg via INTRAVENOUS

## 2015-09-04 MED ORDER — IOHEXOL 300 MG/ML  SOLN
INTRAMUSCULAR | Status: DC | PRN
  Administered 2015-09-04: 15 mL via URETHRAL

## 2015-09-04 MED ORDER — MIDAZOLAM HCL 5 MG/5ML IJ SOLN
INTRAMUSCULAR | Status: DC | PRN
Start: 2015-09-04 — End: 2015-09-04
  Administered 2015-09-04: 2 mg via INTRAVENOUS

## 2015-09-04 MED ORDER — NEOSTIGMINE METHYLSULFATE 10 MG/10ML IV SOLN
INTRAVENOUS | Status: AC
  Filled 2015-09-04: qty 1

## 2015-09-04 MED ORDER — OXYCODONE HCL 5 MG PO TABS: 5.0000 mg | ORAL_TABLET | ORAL | Status: DC | PRN

## 2015-09-04 MED ORDER — LACTATED RINGERS IV SOLN
INTRAVENOUS | Status: DC | PRN
  Administered 2015-09-04: 16:00:00 via INTRAVENOUS

## 2015-09-04 MED ORDER — FENTANYL CITRATE (PF) 250 MCG/5ML IJ SOLN
INTRAMUSCULAR | Status: AC
  Filled 2015-09-04: qty 5

## 2015-09-04 MED ORDER — SUCCINYLCHOLINE CHLORIDE 20 MG/ML IJ SOLN
INTRAMUSCULAR | Status: DC | PRN
  Administered 2015-09-04: 100 mg via INTRAVENOUS

## 2015-09-04 MED ORDER — KETOROLAC TROMETHAMINE 30 MG/ML IJ SOLN
INTRAMUSCULAR | Status: AC
  Filled 2015-09-04: qty 1

## 2015-09-04 MED ORDER — MIDAZOLAM HCL 2 MG/2ML IJ SOLN
INTRAMUSCULAR | Status: AC
  Filled 2015-09-04: qty 2

## 2015-09-04 MED ORDER — CEFAZOLIN SODIUM-DEXTROSE 2-3 GM-% IV SOLR
2.0000 g | INTRAVENOUS | Status: AC
  Administered 2015-09-04: 2 g via INTRAVENOUS

## 2015-09-04 MED ORDER — SODIUM CHLORIDE 0.9 % IV SOLN: 250.0000 mL | INTRAVENOUS | Status: DC | PRN

## 2015-09-04 MED ORDER — GLYCOPYRROLATE 0.2 MG/ML IJ SOLN
INTRAMUSCULAR | Status: AC
  Filled 2015-09-04: qty 3

## 2015-09-04 MED ORDER — PHENYLEPHRINE 40 MCG/ML (10ML) SYRINGE FOR IV PUSH (FOR BLOOD PRESSURE SUPPORT)
PREFILLED_SYRINGE | INTRAVENOUS | Status: AC
  Filled 2015-09-04: qty 10

## 2015-09-04 SURGICAL SUPPLY — 18 items
BAG URO CATCHER STRL LF (DRAPE) ×4 IMPLANT
CATH INTERMIT  6FR 70CM (CATHETERS) ×4 IMPLANT
CLOTH BEACON ORANGE TIMEOUT ST (SAFETY) ×4 IMPLANT
EXTRACTOR STONE NITINOL NGAGE (UROLOGICAL SUPPLIES) ×4 IMPLANT
FIBER LASER FLEXIVA 1000 (UROLOGICAL SUPPLIES) IMPLANT
FIBER LASER FLEXIVA 200 (UROLOGICAL SUPPLIES) IMPLANT
FIBER LASER FLEXIVA 365 (UROLOGICAL SUPPLIES) IMPLANT
FIBER LASER FLEXIVA 550 (UROLOGICAL SUPPLIES) IMPLANT
FIBER LASER TRAC TIP (UROLOGICAL SUPPLIES) IMPLANT
GLOVE BIOGEL M 8.0 STRL (GLOVE) ×4 IMPLANT
GOWN STRL REUS W/TWL XL LVL3 (GOWN DISPOSABLE) ×4 IMPLANT
GUIDEWIRE STR DUAL SENSOR (WIRE) ×4 IMPLANT
MANIFOLD NEPTUNE II (INSTRUMENTS) ×4 IMPLANT
PACK CYSTO (CUSTOM PROCEDURE TRAY) ×4 IMPLANT
SHEATH ACCESS URETERAL 38CM (SHEATH) ×4 IMPLANT
TUBING CONNECTING 10 (TUBING) ×3 IMPLANT
TUBING CONNECTING 10' (TUBING) ×1
WIRE COONS/BENSON .038X145CM (WIRE) IMPLANT

## 2015-09-04 NOTE — Anesthesia Preprocedure Evaluation (Addendum)
Anesthesia Evaluation  Patient identified by MRN, date of birth, ID band Patient awake    Reviewed: Allergy & Precautions, NPO status , Patient's Chart, lab work & pertinent test results  Airway Mallampati: II  TM Distance: >3 FB Neck ROM: Full    Dental no notable dental hx.    Pulmonary neg pulmonary ROS,    Pulmonary exam normal breath sounds clear to auscultation       Cardiovascular hypertension, Pt. on medications Normal cardiovascular exam Rhythm:Regular Rate:Normal     Neuro/Psych negative neurological ROS  negative psych ROS   GI/Hepatic Neg liver ROS, GERD  Medicated,  Endo/Other  negative endocrine ROS  Renal/GU Renal disease  negative genitourinary   Musculoskeletal negative musculoskeletal ROS (+)   Abdominal (+) + obese,   Peds negative pediatric ROS (+)  Hematology negative hematology ROS (+)   Anesthesia Other Findings   Reproductive/Obstetrics negative OB ROS                            Anesthesia Physical Anesthesia Plan  ASA: II  Anesthesia Plan: General   Post-op Pain Management:    Induction: Intravenous  Airway Management Planned: Oral ETT  Additional Equipment:   Intra-op Plan:   Post-operative Plan: Extubation in OR  Informed Consent: I have reviewed the patients History and Physical, chart, labs and discussed the procedure including the risks, benefits and alternatives for the proposed anesthesia with the patient or authorized representative who has indicated his/her understanding and acceptance.   Dental advisory given  Plan Discussed with: CRNA  Anesthesia Plan Comments:         Anesthesia Quick Evaluation

## 2015-09-04 NOTE — Anesthesia Postprocedure Evaluation (Signed)
  Anesthesia Post-op Note  Patient: Steven Preston  Procedure(s) Performed: Procedure(s) (LRB): CYSTOSCOPY WITH RETROGRADE PYELOGRAM, URETEROSCOPY AND BASKET STONE EXTRACTION (Right)  Patient Location: PACU  Anesthesia Type: General  Level of Consciousness: awake and alert   Airway and Oxygen Therapy: Patient Spontanous Breathing  Post-op Pain: mild  Post-op Assessment: Post-op Vital signs reviewed, Patient's Cardiovascular Status Stable, Respiratory Function Stable, Patent Airway and No signs of Nausea or vomiting. HR 80-90 until he got up to restroom. Now 113. He feels fine. No pain. No nausea. Temp 99. Told to return if any new symptoms. He says that his heart rate has always run high.  Last Vitals:  Filed Vitals:   09/04/15 1928  BP: 119/82  Pulse: 113  Temp: 37.2 C  Resp: 20    Post-op Vital Signs: stable   Complications: No apparent anesthesia complications

## 2015-09-04 NOTE — H&P (Signed)
Urology History and Physical Exam  CC: kidney stone  HPI: 44 year old male presents for elective ureteroscopy and extraction of right ureteral calculi.  The patient underwent recent(09/02/2015) extracorporeal shockwave lithotripsy.  The patient is had persistent pain, and today was found to have a fragmented stone, but the 2 fragments were still early high in the ureter, near the initial spot of treatment.  Because of significant pain and the need for pain management, he presents at this time for ureteroscopic management of these nonpassing , symptomaticstones.  PMH: Past Medical History  Diagnosis Date  . Hypertension   . GERD (gastroesophageal reflux disease)   . kidney stones     PSH: Past Surgical History  Procedure Laterality Date  . Knee surgery Bilateral     meniscal tears repair    Allergies: Allergies  Allergen Reactions  . Ampicillin Other (See Comments)    Bad heartburn that created Chest pain, pt. Stated " I can tolerate PCN"    Medications: Prescriptions prior to admission  Medication Sig Dispense Refill Last Dose  . acetaminophen (TYLENOL) 325 MG tablet Take 325 mg by mouth 2 (two) times daily as needed for moderate pain or headache.   09/02/2015  . benazepril (LOTENSIN) 20 MG tablet Take 20 mg by mouth daily.   09/04/2015 at 0800  . diphenhydramine-acetaminophen (TYLENOL PM) 25-500 MG TABS tablet Take 1 tablet by mouth at bedtime as needed (sleep).   Past Week at Unknown time  . HYDROmorphone (DILAUDID) 4 MG tablet Take 1 tablet (4 mg total) by mouth every 4 (four) hours as needed for moderate pain or severe pain (for pain). From kidney stone 30 tablet 0 09/04/2015 at 0600  . omeprazole (PRILOSEC) 40 MG capsule Take 40 mg by mouth daily as needed (acid reflux).    Past Week at Unknown time  . ondansetron (ZOFRAN ODT) 8 MG disintegrating tablet Take 1 tablet (8 mg total) by mouth every 8 (eight) hours as needed for nausea or vomiting. 20 tablet 0 09/01/2015  .  senna-docusate (SENOKOT-S) 8.6-50 MG tablet Take 1 tablet by mouth 2 (two) times daily. While taking pain meds to prevent constipation 30 tablet 0 Past Month at Unknown time  . sulfamethoxazole-trimethoprim (BACTRIM DS,SEPTRA DS) 800-160 MG tablet Take 1 tablet by mouth 2 (two) times daily. X 5 days to prevent infection (Patient taking differently: Take 1 tablet by mouth once. X 5 days to prevent infection) 10 tablet 0 09/04/2015 at 0800  . tamsulosin (FLOMAX) 0.4 MG CAPS capsule To help pass ureteral stone fragments 30 capsule 1 09/04/2015 at 0800  . VIAGRA 100 MG tablet Take 100 mg by mouth daily as needed for erectile dysfunction.    Past Month at Unknown time     Social History: Social History   Social History  . Marital Status: Married    Spouse Name: N/A  . Number of Children: N/A  . Years of Education: N/A   Occupational History  . Not on file.   Social History Main Topics  . Smoking status: Never Smoker   . Smokeless tobacco: Not on file  . Alcohol Use: No  . Drug Use: No  . Sexual Activity: Not on file   Other Topics Concern  . Not on file   Social History Narrative    Family History: History reviewed. No pertinent family history.  Review of Systems: Positive: right flank pain Negative:   A further 10 point review of systems was negative except what is listed in  the HPI.                  Physical Exam: @ General: No acute distress.  Awake. Head:  Normocephalic.  Atraumatic. ENT:  EOMI.  Mucous membranes moist Neck:  Supple.  No lymphadenopathy. CV:  S1 present. S2 present. Regular rate. Pulmonary: Equal effort bilaterally.  Clear to auscultation bilaterally. Abdomen: Soft.  none tender to palpation. Skin:  Normal turgor.  No visible rash. Extremity: No gross deformity of bilateral upper extremities.  No gross deformity of                             lower extremities. Neurologic: Alert. Appropriate mood.    Studies:  Recent Labs     09/03/15   1955  HGB  13.3  WBC  13.4*  PLT  241    Recent Labs     09/03/15  1955  NA  135  K  3.6  CL  103  CO2  25  BUN  14  CREATININE  1.86*  CALCIUM  8.6*  GFRNONAA  42*  GFRAA  48*     No results for input(s): INR, APTT in the last 72 hours.  Invalid input(s): PT   Invalid input(s): ABG    Assessment:  Right ureteral calculi-status post lithotripsy with minimal fragmentation and incomplete passage with significant persistent pain  Plan: Ureteroscopic management, possible using laser.  Risks and complications were discussed.

## 2015-09-04 NOTE — Op Note (Addendum)
PATIENT:  Steven Preston  PRE-OPERATIVE DIAGNOSIS: Right ureteral stonwe  POST-OPERATIVE DIAGNOSIS: Same  PROCEDURE: Cystoscopy, right retrograde pyelogram, right ureteral dialtion, right ureterosopy, extraction of ureteral stones, right J2 stent placement  SURGEON:  Bertram MillardStephen M. Shaindel Sweeten, M.D.  ANESTHESIA:  General  EBL:  Minimal  DRAINS: 24 cm x 6 fr contour J2 stent with string  LOCAL MEDICATIONS USED:  None  SPECIMEN:  Stones to pt's wife   INDICATION: Steven DaubDon W Schiano is a 44 year old male who recently underwent lithotripsy for a right distal ureteral stone 2 days ago. He has had significant pain, and no passage of his stone fragments, which were imaged today on a KUB. Because of his intractable pain, he presents for ureteroscopic management of his stone. I have discussed the procedure with the patient in the holding area, as well as risks and complications that include but are not limited to ureteral injury, infection, persistent pain, bleeding, among others. He understands these and desires to proceed.  Description of procedure: The patient was properly identified and marked (if applicable) in the holding area. They were then  taken to the operating room and placed on the table in a supine position. General anesthesia was then administered. Once fully anesthetized the patient was moved to the dorsolithotomy position and the genitalia and perineum were sterilely prepped and draped in standard fashion. An official timeout was then performed.  A 23 French cystoscope was advanced through the urethra. The urethra was normal, without lesions. Prostatic urethra was nonobstructive. The bladder was entered and inspected circumferentially. There were no tumors, trabeculations, or foreign bodies. Ureteral orifices were normal in configuration and location. The right ureteral orifice was cannulated with an open-ended catheter, 6 [place]French, with the assistance of a sensor-tip guidewire. General retrograde  ureteropyelogram was performed. This revealed several filling defects in the distal ureter, a proximally 3-4 cm up. This was consistent with the previously mentioned stones, although there seemed to be more than 2 filling defects. There was proximal hydroureteronephrosis with pyelocaliectasis. No strictures were noted.  I advanced the sensor-tip guidewire all the way to the renal pelvis. The open-ended catheter and the cystoscope were removed. I then dilated the right distal ureter first with the inner core and then with the entire 12/14 medium ureteral access sheath. At this point, the access sheath was removed, the sensor-tip guidewire was left in place. A 6 French dual-lumen semirigid ureteroscope was then passed under direct vision through the urethra, up into the bladder and then up to the ureter. The first stone fragment was identified. It was grasped with the engage basket and extracted through the urethra. This process was repeated approximately 5 times, each time with a small fragment removed. The ureteroscope was passed one last time up to the proximal ureter, wherefurther stones were seen. There was minimal trauma to the distal ureter, and I thought that the patient would be fine without any stent. The bladder was then drained after the ureteroscope was removed. 2% viscous lidocaine was placed in his urethra. The patient was then awakened and taken to the PACU in stable condition. He tolerated the procedure well.

## 2015-09-04 NOTE — Anesthesia Procedure Notes (Signed)
Procedure Name: Intubation Date/Time: 09/04/2015 5:58 PM Performed by: Jarvis NewcomerARMISTEAD, Gaither Biehn A Pre-anesthesia Checklist: Patient identified, Emergency Drugs available, Suction available and Patient being monitored Oxygen Delivery Method: Circle system utilized Preoxygenation: Pre-oxygenation with 100% oxygen Intubation Type: IV induction Ventilation: Mask ventilation without difficulty and Oral airway inserted - appropriate to patient size Laryngoscope Size: Glidescope and 4 Grade View: Grade I Tube type: Oral Tube size: 7.5 mm Airway Equipment and Method: Stylet and Video-laryngoscopy Placement Confirmation: ETT inserted through vocal cords under direct vision,  positive ETCO2 and breath sounds checked- equal and bilateral Secured at: 21 cm Tube secured with: Tape Dental Injury: Teeth and Oropharynx as per pre-operative assessment  Comments: Elective Glidescope intubation due to small mouth opening.

## 2015-09-04 NOTE — Discharge Instructions (Signed)
POSTOPERATIVE CARE AFTER URETEROSCOPy  Diet  Once you have adequately recovered from anesthesia, you may gradually advance your diet, as tolerated, to your regular diet.  Activities  You may gradually increase your activities to your normal unrestricted level the day following your procedure.  Medications  You should resume all preoperative medications. If you are on aspirin-like compounds, you should not resume these until the blood clears from your urine. If given an antibiotic by the surgeon, take these until they are completed. You may also be given, if you have a stent, medications to decrease the urinary frequency and urgency.  Pain  After ureteroscopy, there may be some pain on the side of the scope. Take your pain medicine for this. Usually, this pain resolves within a day or 2.  Fever  Please report any fever over 100 to the doctor.

## 2015-09-04 NOTE — Transfer of Care (Signed)
Immediate Anesthesia Transfer of Care Note  Patient: Steven Preston  Procedure(s) Performed: Procedure(s): CYSTOSCOPY WITH RETROGRADE PYELOGRAM, URETEROSCOPY AND BASKET STONE EXTRACTION (Right)  Patient Location: PACU  Anesthesia Type:General  Level of Consciousness: awake, alert , oriented and patient cooperative  Airway & Oxygen Therapy: Patient Spontanous Breathing and Patient connected to face mask oxygen  Post-op Assessment: Report given to RN, Post -op Vital signs reviewed and stable and Patient moving all extremities  Post vital signs: Reviewed and stable  Last Vitals: There were no vitals filed for this visit.  Complications: No apparent anesthesia complications

## 2015-09-05 ENCOUNTER — Encounter (HOSPITAL_COMMUNITY): Payer: Self-pay | Admitting: Urology

## 2016-11-20 IMAGING — CT CT RENAL STONE PROTOCOL
2 of 4 series · 16 of 46 positions shown, 18 images · non-contrast
Comparison: CT of the abdomen and pelvis from 08/29/2012

CLINICAL DATA: Acute onset of right flank pain.  Initial encounter.

EXAM:
CT ABDOMEN AND PELVIS WITHOUT CONTRAST
TECHNIQUE: Multidetector CT imaging of the abdomen and pelvis was performed
following the standard protocol without IV contrast.

[Series 2: renal stone > 200 lbs 5.0 b31f · axial · 0.81mm/px · z∈[-641,-166]mm · 13 of 103 slices shown, 15 images]
[im 4/103  soft-tissue]
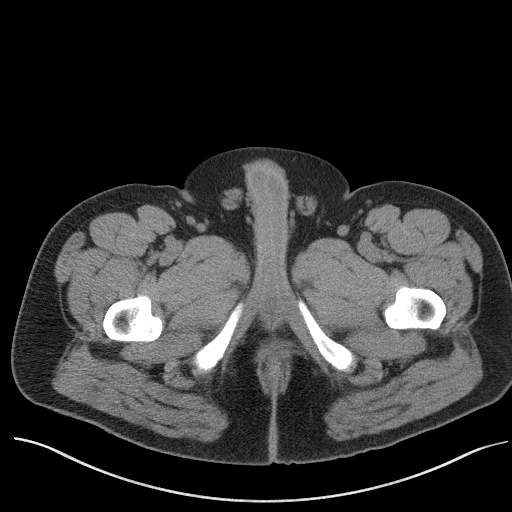
[im 4/103  bone]
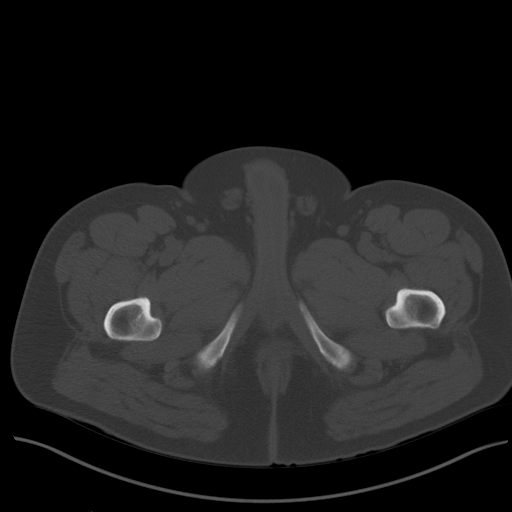
[im 12/103  soft-tissue]
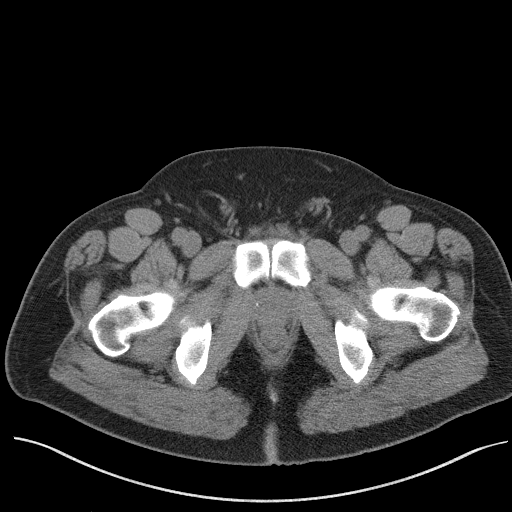
[im 20/103  soft-tissue]
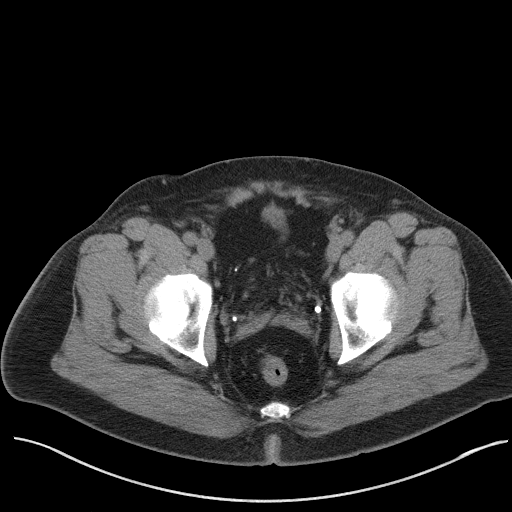
[im 28/103  soft-tissue]
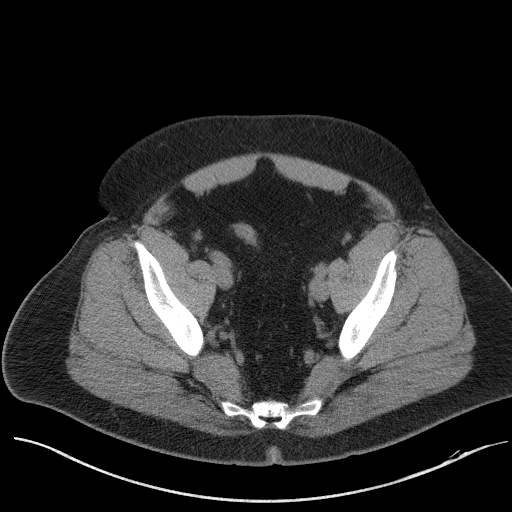
[im 36/103  soft-tissue]
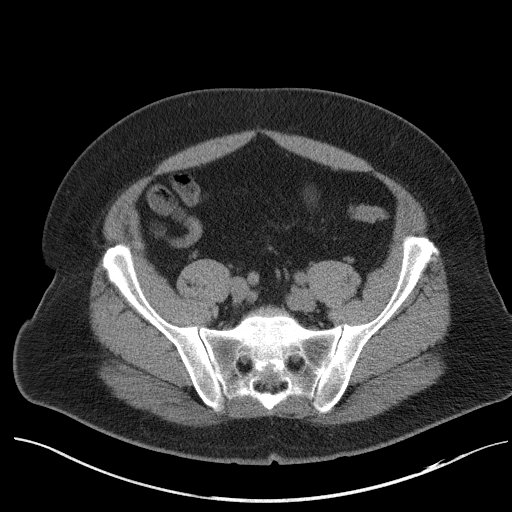
[im 44/103  soft-tissue]
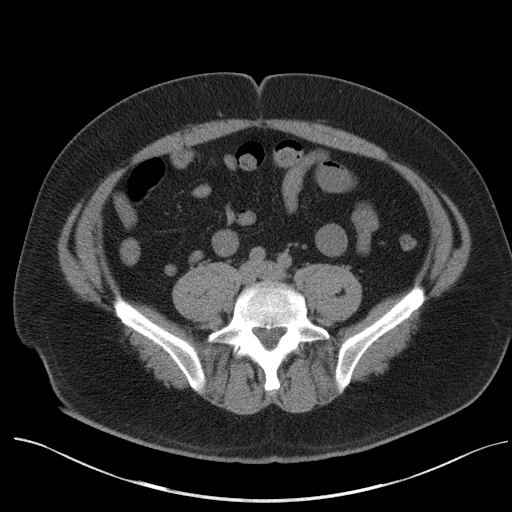
[im 52/103  soft-tissue]
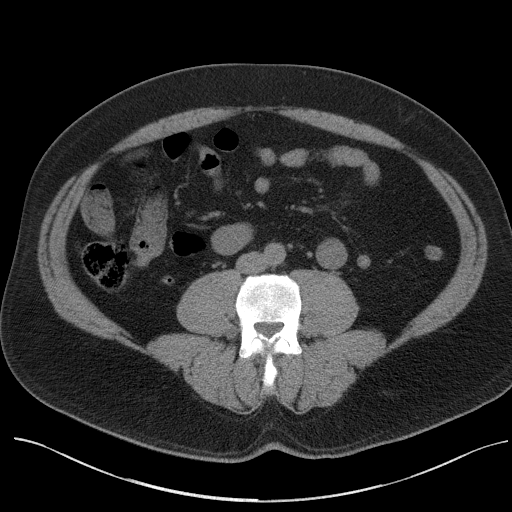
[im 59/103  soft-tissue]
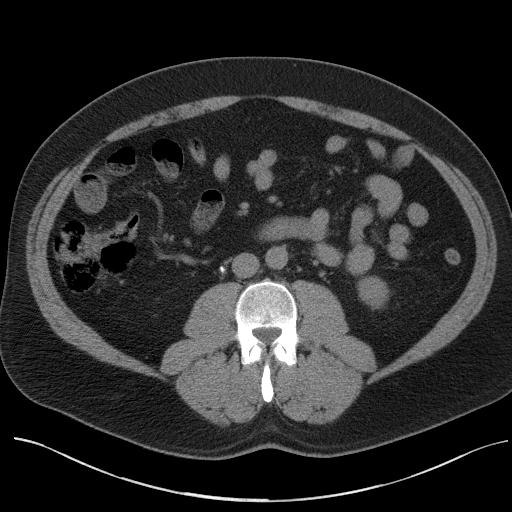
[im 67/103  soft-tissue]
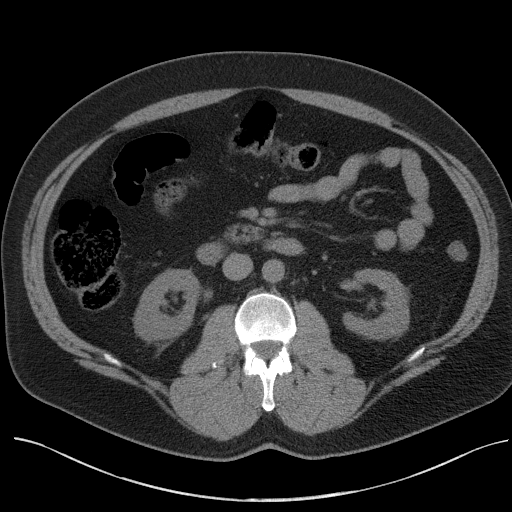
[im 67/103  bone]
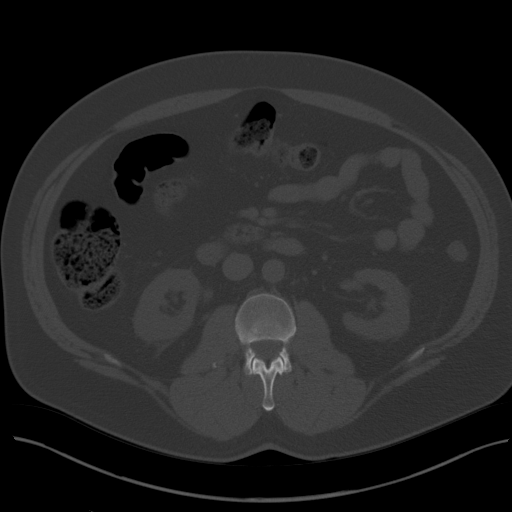
[im 75/103  soft-tissue]
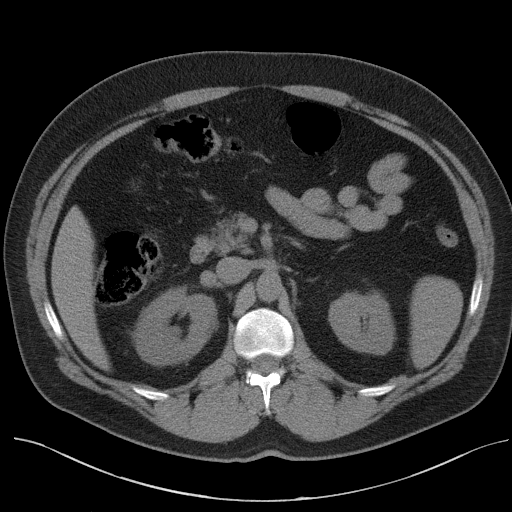
[im 83/103  soft-tissue]
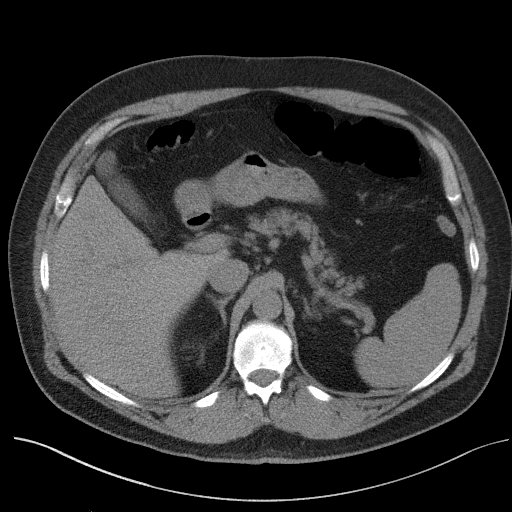
[im 91/103  soft-tissue]
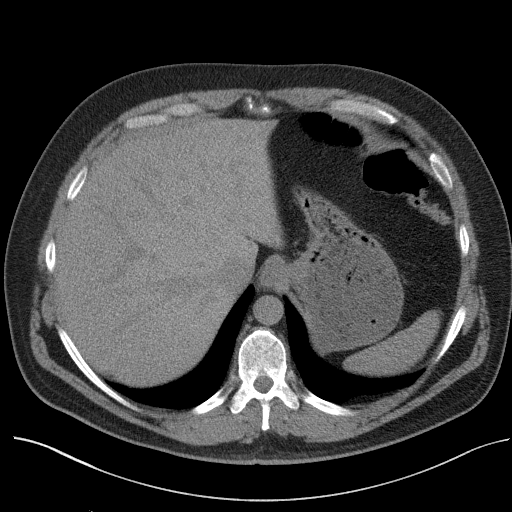
[im 99/103  soft-tissue]
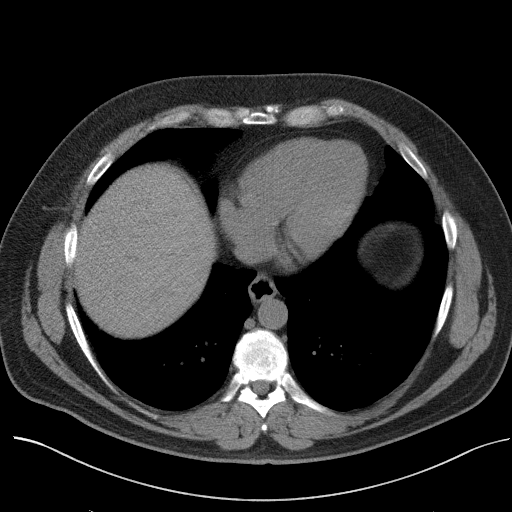

[Series 5: renal stone 3.0 coronal · coronal · 0.84mm/px · 3 of 106 slices shown]
[im 36/106  soft-tissue]
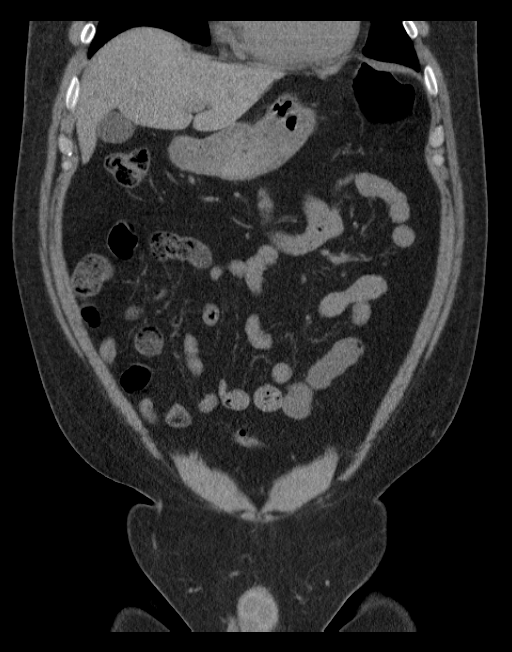
[im 47/106  soft-tissue]
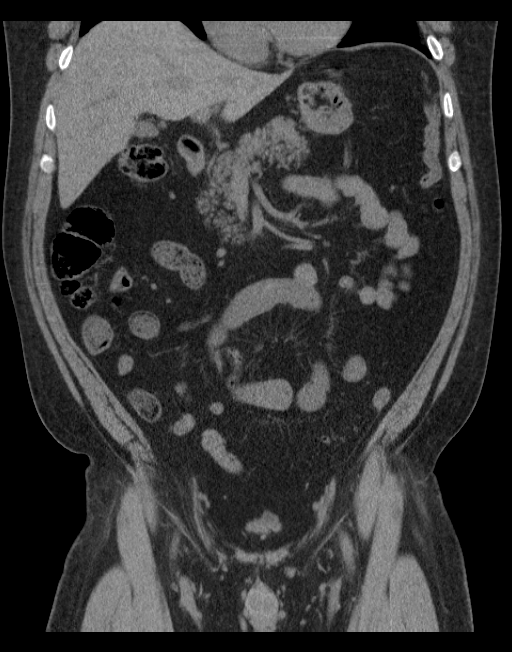
[im 59/106  soft-tissue]
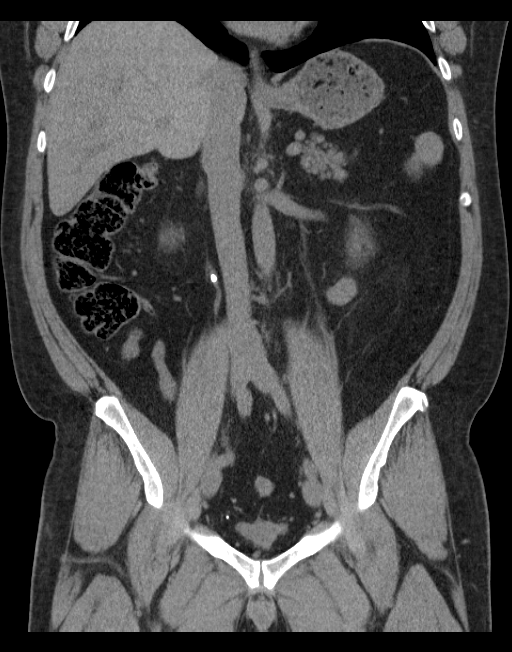

[16 of 46 positions shown; findings below may reference images not displayed]

FINDINGS: The visualized lung bases are clear.

The liver and spleen are unremarkable in appearance. The gallbladder
is within normal limits. The pancreas and adrenal glands are
unremarkable.

There is minimal right-sided hydronephrosis, with an obstructing 7 x
6 mm stone noted in the proximal right ureter, 3 cm below the right
renal pelvis. Nonspecific perinephric stranding is noted
bilaterally. No nonobstructing renal stones are identified.

No free fluid is identified. The small bowel is unremarkable in
appearance. The stomach is within normal limits. No acute vascular
abnormalities are seen.

The appendix is normal in caliber, without evidence of appendicitis.
Minimal diverticulosis is noted at the proximal sigmoid colon. The
colon is otherwise unremarkable in appearance.

The bladder is decompressed and not well assessed. The prostate is
normal in size. No inguinal lymphadenopathy is seen.

No acute osseous abnormalities are identified.
IMPRESSION: 1. Minimal right-sided hydronephrosis, with an obstructing 7 x 6 mm
stone in the proximal right ureter, 3 cm below the right renal
pelvis.
2. Minimal diverticulosis at the proximal sigmoid colon.

## 2016-11-22 IMAGING — CR DG ABDOMEN 1V
2 series · 2 of 2 positions shown · non-contrast
Comparison: CT, 08/31/2015

CLINICAL DATA: Pre op lithotripsy. Right ureteral stone

EXAM:
ABDOMEN - 1 VIEW

[t abdomen supine *]
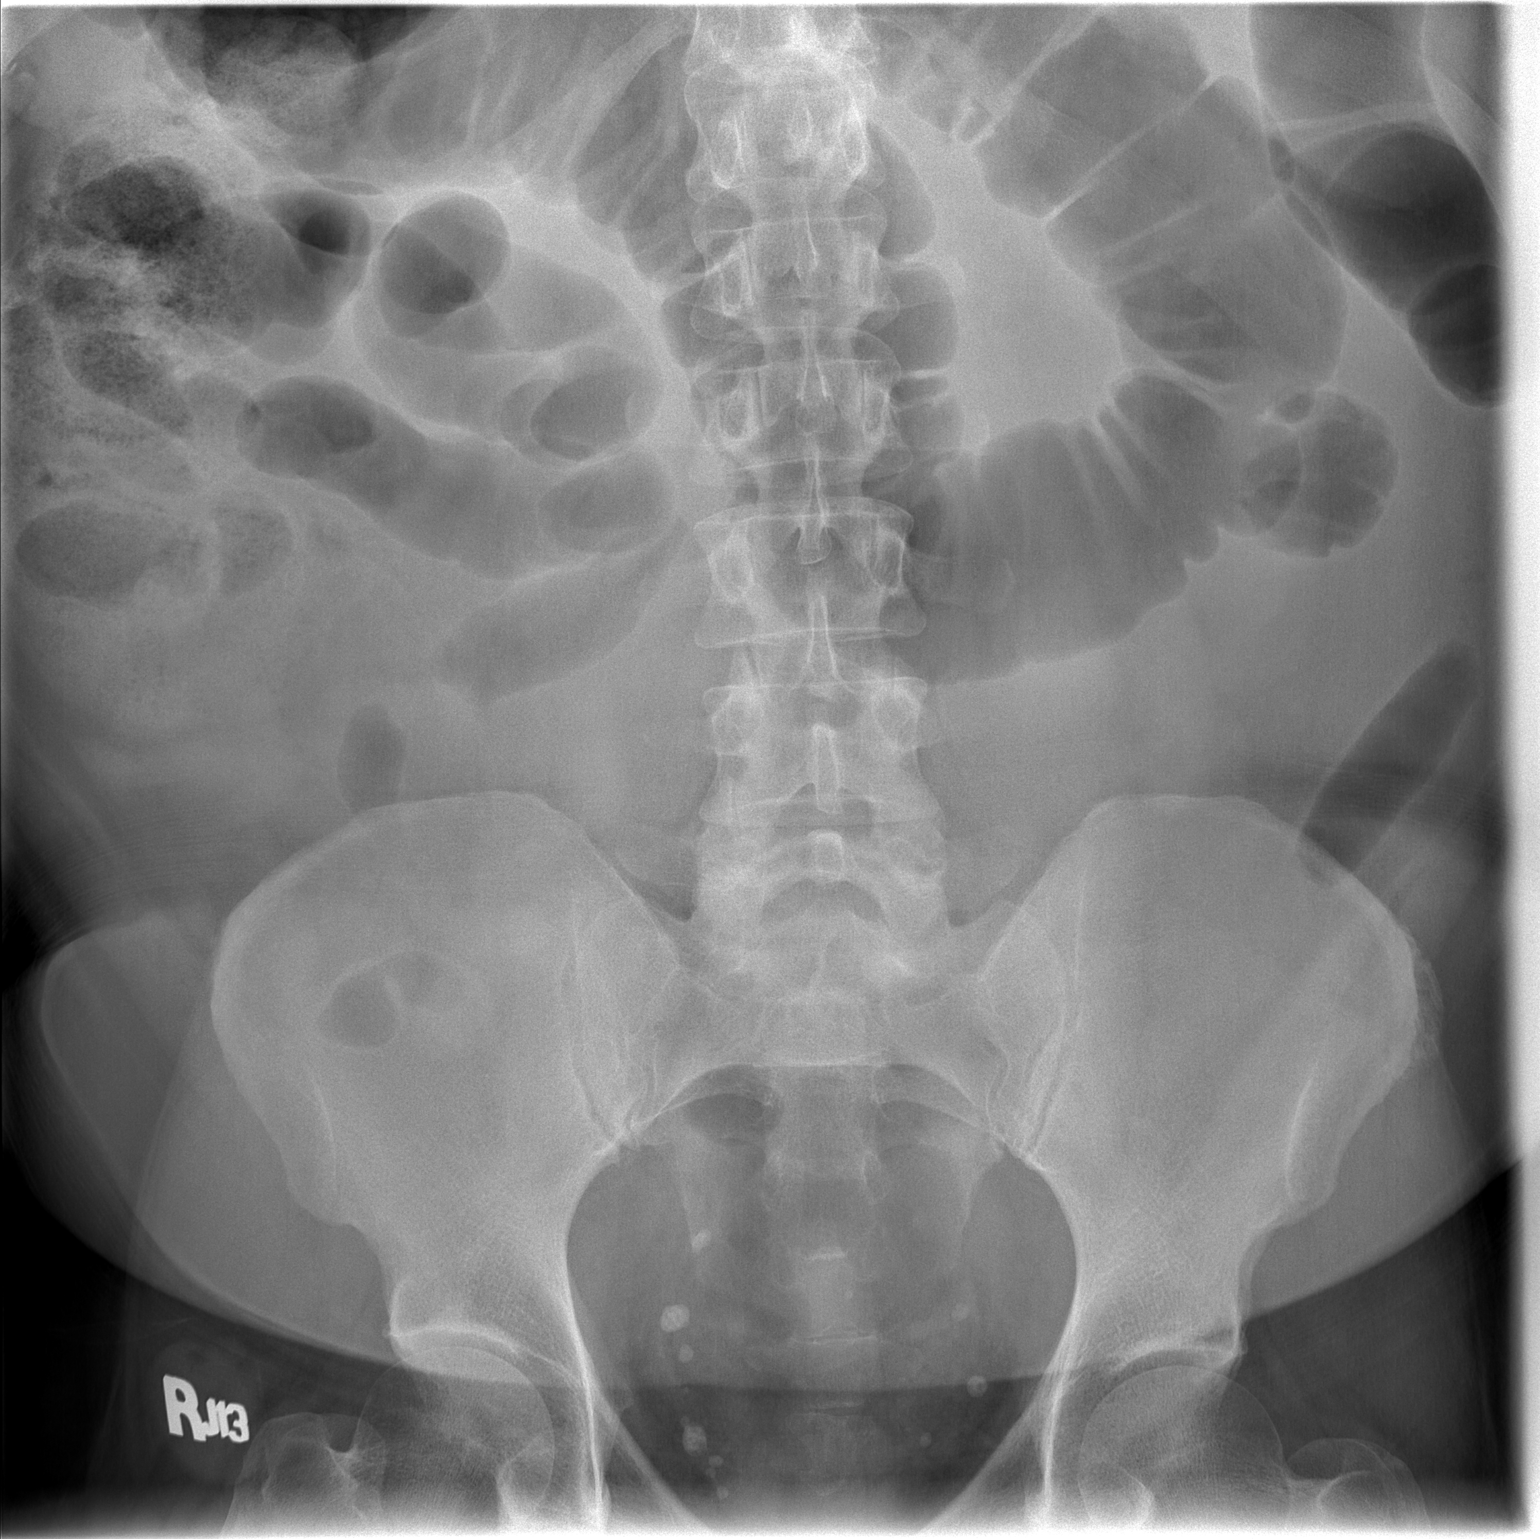

[t abdomen supine]
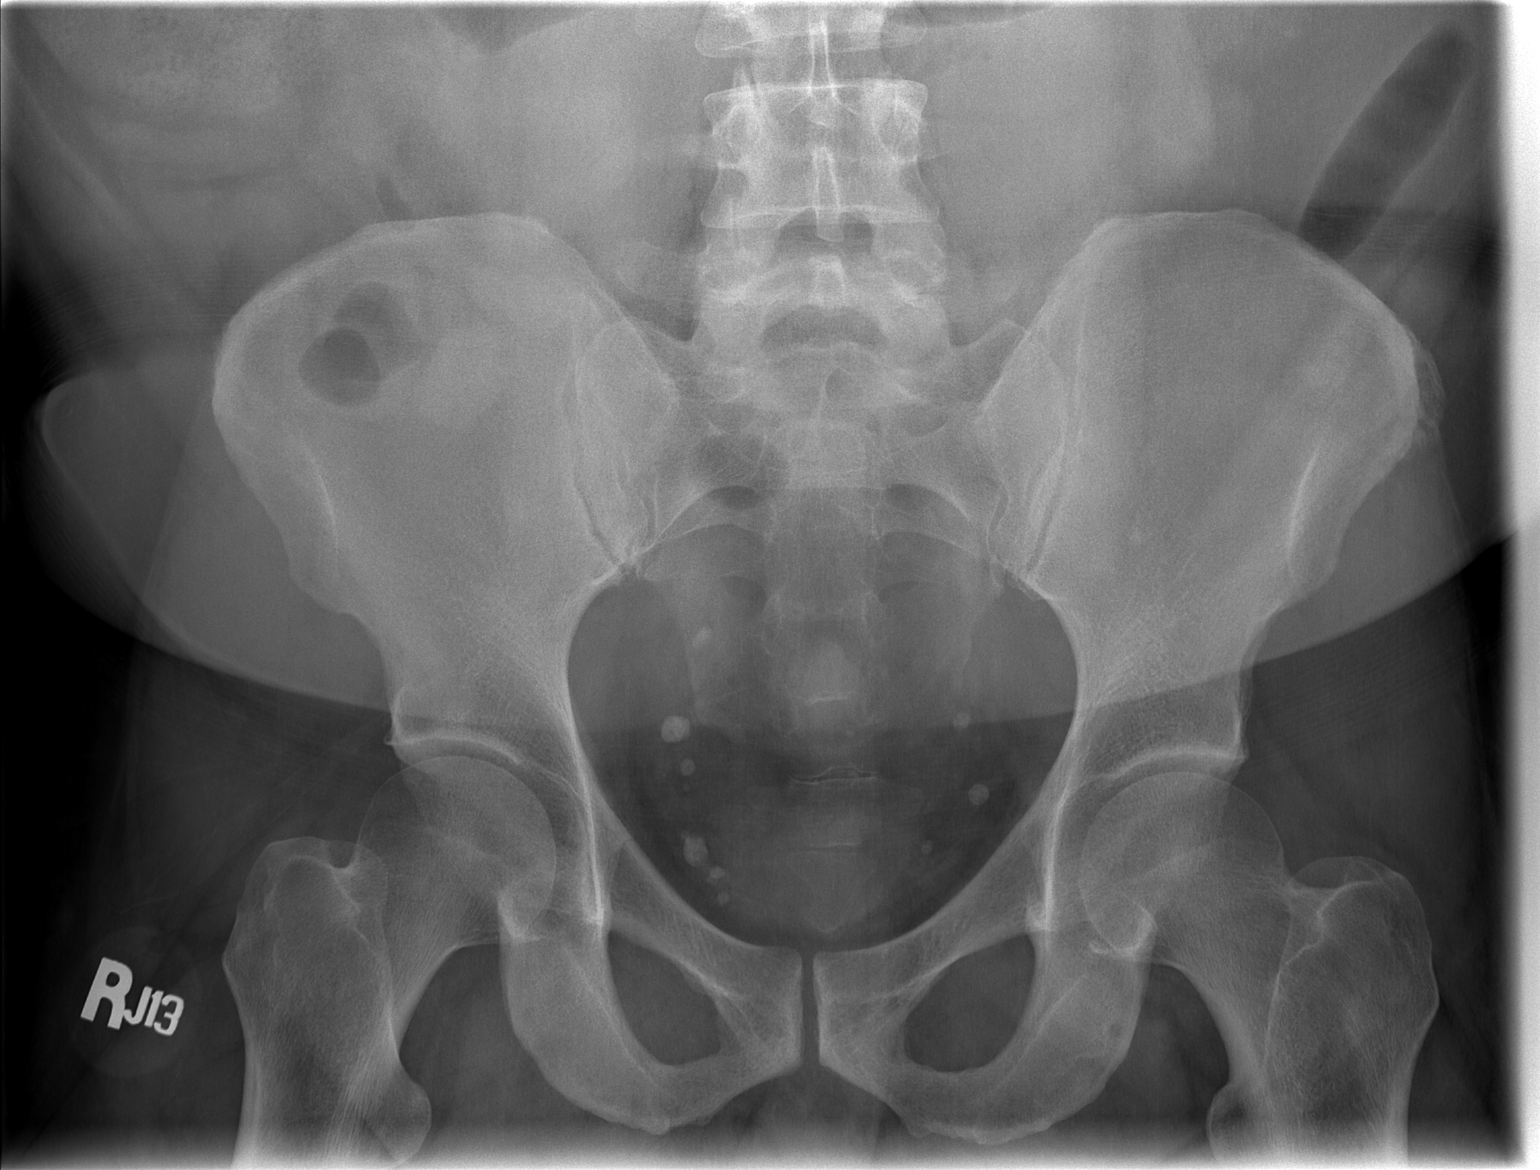

[2 of 2 positions shown; findings below may reference images not displayed]

FINDINGS: There is an oval calculus in the right pelvis above a larger
phlebolith, measuring 6 mm in size, consistent with the right
ureteral stone seen on CT, now lying in the distal ureter. There is
no evidence of another ureteral stone or an intrarenal stone.
Several stable phleboliths are noted in the pelvis.

Normal bowel gas pattern. Soft tissues otherwise unremarkable. Bony
structures are unremarkable.
IMPRESSION: 1. 6 mm stone now lies in the distal right ureter.

## 2017-06-02 DIAGNOSIS — Z0000 Encounter for general adult medical examination without abnormal findings: Secondary | ICD-10-CM | POA: Diagnosis not present

## 2017-06-02 DIAGNOSIS — I10 Essential (primary) hypertension: Secondary | ICD-10-CM | POA: Diagnosis not present

## 2017-06-02 DIAGNOSIS — K219 Gastro-esophageal reflux disease without esophagitis: Secondary | ICD-10-CM | POA: Diagnosis not present

## 2017-09-21 DIAGNOSIS — H40013 Open angle with borderline findings, low risk, bilateral: Secondary | ICD-10-CM | POA: Diagnosis not present

## 2017-09-21 DIAGNOSIS — H2513 Age-related nuclear cataract, bilateral: Secondary | ICD-10-CM | POA: Diagnosis not present

## 2017-09-21 DIAGNOSIS — H35033 Hypertensive retinopathy, bilateral: Secondary | ICD-10-CM | POA: Diagnosis not present

## 2018-06-24 DIAGNOSIS — I10 Essential (primary) hypertension: Secondary | ICD-10-CM | POA: Diagnosis not present

## 2018-06-24 DIAGNOSIS — N529 Male erectile dysfunction, unspecified: Secondary | ICD-10-CM | POA: Diagnosis not present

## 2018-06-24 DIAGNOSIS — K219 Gastro-esophageal reflux disease without esophagitis: Secondary | ICD-10-CM | POA: Diagnosis not present

## 2018-06-24 DIAGNOSIS — Z0000 Encounter for general adult medical examination without abnormal findings: Secondary | ICD-10-CM | POA: Diagnosis not present

## 2018-09-29 DIAGNOSIS — H2513 Age-related nuclear cataract, bilateral: Secondary | ICD-10-CM | POA: Diagnosis not present

## 2018-09-29 DIAGNOSIS — H25013 Cortical age-related cataract, bilateral: Secondary | ICD-10-CM | POA: Diagnosis not present

## 2018-09-29 DIAGNOSIS — H40013 Open angle with borderline findings, low risk, bilateral: Secondary | ICD-10-CM | POA: Diagnosis not present

## 2019-01-09 DIAGNOSIS — N3000 Acute cystitis without hematuria: Secondary | ICD-10-CM | POA: Diagnosis not present

## 2019-08-01 ENCOUNTER — Other Ambulatory Visit: Payer: Self-pay | Admitting: Family Medicine

## 2019-08-01 DIAGNOSIS — E7800 Pure hypercholesterolemia, unspecified: Secondary | ICD-10-CM

## 2019-09-01 ENCOUNTER — Other Ambulatory Visit: Payer: 59

## 2019-09-07 ENCOUNTER — Other Ambulatory Visit: Payer: Self-pay | Admitting: Family Medicine

## 2019-09-07 ENCOUNTER — Ambulatory Visit
Admission: RE | Admit: 2019-09-07 | Discharge: 2019-09-07 | Disposition: A | Discharge status: Home / Self Care | Payer: No Typology Code available for payment source | Source: Ambulatory Visit | Attending: Family Medicine | Admitting: Family Medicine

## 2019-09-07 DIAGNOSIS — E7800 Pure hypercholesterolemia, unspecified: Secondary | ICD-10-CM

## 2019-10-11 ENCOUNTER — Ambulatory Visit
Admission: RE | Admit: 2019-10-11 | Discharge: 2019-10-11 | Disposition: A | Discharge status: Home / Self Care | Payer: 59 | Source: Ambulatory Visit | Attending: Family Medicine | Admitting: Family Medicine

## 2019-10-11 DIAGNOSIS — E7800 Pure hypercholesterolemia, unspecified: Secondary | ICD-10-CM
